# Patient Record
Sex: Male | Born: 1940 | Race: White | Hispanic: No | Marital: Married | Smoking: Former smoker
Health system: Southern US, Community
[De-identification: ages and names within clinical notes are randomized; demographics above are authoritative.]

## PROBLEM LIST (undated history)

## (undated) DIAGNOSIS — I440 Atrioventricular block, first degree: Secondary | ICD-10-CM

## (undated) DIAGNOSIS — I2589 Other forms of chronic ischemic heart disease: Secondary | ICD-10-CM

## (undated) DIAGNOSIS — I495 Sick sinus syndrome: Secondary | ICD-10-CM

## (undated) DIAGNOSIS — Z95810 Presence of automatic (implantable) cardiac defibrillator: Secondary | ICD-10-CM

## (undated) DIAGNOSIS — I498 Other specified cardiac arrhythmias: Secondary | ICD-10-CM

## (undated) DIAGNOSIS — E663 Overweight: Secondary | ICD-10-CM

## (undated) DIAGNOSIS — I4892 Unspecified atrial flutter: Secondary | ICD-10-CM

## (undated) DIAGNOSIS — I5022 Chronic systolic (congestive) heart failure: Secondary | ICD-10-CM

## (undated) HISTORY — DX: Chronic systolic (congestive) heart failure: I50.22

## (undated) HISTORY — DX: Atrioventricular block, first degree: I44.0

## (undated) HISTORY — DX: Other forms of chronic ischemic heart disease: I25.89

## (undated) HISTORY — DX: Sick sinus syndrome: I49.5

## (undated) HISTORY — DX: Overweight: E66.3

## (undated) HISTORY — DX: Other specified cardiac arrhythmias: I49.8

## (undated) HISTORY — DX: Presence of automatic (implantable) cardiac defibrillator: Z95.810

## (undated) HISTORY — DX: Unspecified atrial flutter: I48.92

---

## 2002-07-23 HISTORY — PX: CORONARY ARTERY BYPASS GRAFT: SHX141

## 2003-07-18 ENCOUNTER — Other Ambulatory Visit: Payer: Self-pay

## 2003-07-19 ENCOUNTER — Other Ambulatory Visit: Payer: Self-pay

## 2003-07-21 ENCOUNTER — Inpatient Hospital Stay (HOSPITAL_COMMUNITY)
Admission: AD | Admit: 2003-07-21 | Discharge: 2003-07-28 | Payer: Self-pay | Admitting: Thoracic Surgery (Cardiothoracic Vascular Surgery)

## 2003-07-24 HISTORY — PX: OTHER SURGICAL HISTORY: SHX169

## 2003-08-06 ENCOUNTER — Inpatient Hospital Stay (HOSPITAL_COMMUNITY): Admission: EM | Admit: 2003-08-06 | Discharge: 2003-09-06 | Payer: Self-pay | Admitting: Emergency Medicine

## 2003-09-09 ENCOUNTER — Inpatient Hospital Stay (HOSPITAL_COMMUNITY): Admission: EM | Admit: 2003-09-09 | Discharge: 2003-10-12 | Payer: Self-pay | Admitting: Emergency Medicine

## 2003-10-06 ENCOUNTER — Encounter: Payer: Self-pay | Admitting: Cardiology

## 2003-11-05 ENCOUNTER — Encounter
Admission: RE | Admit: 2003-11-05 | Discharge: 2003-11-05 | Payer: Self-pay | Admitting: Thoracic Surgery (Cardiothoracic Vascular Surgery)

## 2003-12-03 ENCOUNTER — Ambulatory Visit (HOSPITAL_COMMUNITY): Admission: RE | Admit: 2003-12-03 | Discharge: 2003-12-03 | Payer: Self-pay | Admitting: Plastic Surgery

## 2003-12-21 ENCOUNTER — Other Ambulatory Visit: Payer: Self-pay

## 2003-12-28 ENCOUNTER — Other Ambulatory Visit: Payer: Self-pay

## 2003-12-30 ENCOUNTER — Other Ambulatory Visit: Payer: Self-pay

## 2004-08-31 ENCOUNTER — Ambulatory Visit: Payer: Self-pay

## 2004-10-09 ENCOUNTER — Ambulatory Visit: Payer: Self-pay | Admitting: Internal Medicine

## 2004-10-24 ENCOUNTER — Emergency Department: Payer: Self-pay | Admitting: Emergency Medicine

## 2004-10-24 ENCOUNTER — Inpatient Hospital Stay: Payer: Self-pay | Admitting: Internal Medicine

## 2004-11-10 ENCOUNTER — Ambulatory Visit: Payer: Self-pay

## 2004-11-10 ENCOUNTER — Ambulatory Visit: Payer: Self-pay | Admitting: Cardiology

## 2004-11-15 ENCOUNTER — Ambulatory Visit: Payer: Self-pay | Admitting: Internal Medicine

## 2004-11-22 ENCOUNTER — Ambulatory Visit (HOSPITAL_COMMUNITY): Admission: RE | Admit: 2004-11-22 | Discharge: 2004-11-23 | Payer: Self-pay | Admitting: Internal Medicine

## 2004-11-22 ENCOUNTER — Ambulatory Visit: Payer: Self-pay | Admitting: Cardiology

## 2004-11-22 ENCOUNTER — Encounter: Payer: Self-pay | Admitting: Cardiology

## 2004-12-07 ENCOUNTER — Ambulatory Visit: Payer: Self-pay | Admitting: Internal Medicine

## 2004-12-12 ENCOUNTER — Ambulatory Visit: Payer: Self-pay | Admitting: Internal Medicine

## 2004-12-28 ENCOUNTER — Ambulatory Visit: Payer: Self-pay | Admitting: Internal Medicine

## 2006-10-07 ENCOUNTER — Other Ambulatory Visit: Payer: Self-pay

## 2006-10-08 ENCOUNTER — Inpatient Hospital Stay: Payer: Self-pay | Admitting: Internal Medicine

## 2007-02-02 ENCOUNTER — Inpatient Hospital Stay: Payer: Self-pay | Admitting: Internal Medicine

## 2007-02-02 ENCOUNTER — Other Ambulatory Visit: Payer: Self-pay

## 2007-02-26 ENCOUNTER — Ambulatory Visit: Payer: Self-pay | Admitting: Internal Medicine

## 2007-04-04 ENCOUNTER — Inpatient Hospital Stay: Payer: Self-pay | Admitting: Internal Medicine

## 2007-05-02 ENCOUNTER — Ambulatory Visit: Payer: Self-pay | Admitting: Internal Medicine

## 2007-05-23 ENCOUNTER — Ambulatory Visit: Payer: Self-pay | Admitting: Internal Medicine

## 2007-05-24 ENCOUNTER — Ambulatory Visit: Payer: Self-pay | Admitting: Internal Medicine

## 2007-05-24 ENCOUNTER — Inpatient Hospital Stay: Payer: Self-pay | Admitting: Internal Medicine

## 2007-05-24 ENCOUNTER — Other Ambulatory Visit: Payer: Self-pay

## 2007-05-30 ENCOUNTER — Other Ambulatory Visit: Payer: Self-pay

## 2007-06-05 ENCOUNTER — Other Ambulatory Visit: Payer: Self-pay

## 2007-06-24 ENCOUNTER — Ambulatory Visit: Payer: Self-pay | Admitting: Internal Medicine

## 2007-06-30 ENCOUNTER — Ambulatory Visit: Payer: Self-pay | Admitting: Cardiology

## 2007-09-03 ENCOUNTER — Ambulatory Visit: Payer: Self-pay | Admitting: Internal Medicine

## 2007-09-09 ENCOUNTER — Ambulatory Visit: Payer: Self-pay | Admitting: Internal Medicine

## 2007-10-30 ENCOUNTER — Ambulatory Visit: Payer: Self-pay | Admitting: Internal Medicine

## 2007-11-17 ENCOUNTER — Ambulatory Visit: Payer: Self-pay | Admitting: Internal Medicine

## 2008-02-02 ENCOUNTER — Ambulatory Visit: Payer: Self-pay | Admitting: Internal Medicine

## 2008-03-08 ENCOUNTER — Ambulatory Visit: Payer: Self-pay | Admitting: Internal Medicine

## 2008-05-03 ENCOUNTER — Ambulatory Visit: Payer: Self-pay | Admitting: Internal Medicine

## 2008-08-30 ENCOUNTER — Ambulatory Visit: Payer: Self-pay | Admitting: Internal Medicine

## 2008-12-10 DIAGNOSIS — E663 Overweight: Secondary | ICD-10-CM | POA: Insufficient documentation

## 2008-12-10 DIAGNOSIS — I440 Atrioventricular block, first degree: Secondary | ICD-10-CM | POA: Insufficient documentation

## 2008-12-10 DIAGNOSIS — I5022 Chronic systolic (congestive) heart failure: Secondary | ICD-10-CM

## 2008-12-10 DIAGNOSIS — I4891 Unspecified atrial fibrillation: Secondary | ICD-10-CM

## 2008-12-10 DIAGNOSIS — Z95810 Presence of automatic (implantable) cardiac defibrillator: Secondary | ICD-10-CM | POA: Insufficient documentation

## 2008-12-10 DIAGNOSIS — I498 Other specified cardiac arrhythmias: Secondary | ICD-10-CM

## 2008-12-10 DIAGNOSIS — I495 Sick sinus syndrome: Secondary | ICD-10-CM

## 2008-12-10 DIAGNOSIS — I2589 Other forms of chronic ischemic heart disease: Secondary | ICD-10-CM | POA: Insufficient documentation

## 2008-12-15 ENCOUNTER — Encounter: Payer: Self-pay | Admitting: Internal Medicine

## 2008-12-15 ENCOUNTER — Ambulatory Visit: Payer: Self-pay | Admitting: Internal Medicine

## 2009-02-28 ENCOUNTER — Ambulatory Visit: Payer: Self-pay | Admitting: Internal Medicine

## 2009-03-16 IMAGING — US US EXTREM LOW VENOUS*L*
1 series · 17 of 24 positions shown · non-contrast
Comparison: none

REASON FOR EXAM: eval DVT  CALL report  [id number]  ext [id number]
COMMENTS:

PROCEDURE:     US  - US DOPPLER LOW EXTR LEFT  - May 02, 2007  [DATE]
RESULT:     There is observed loss of compressibility of the popliteal vein.
Doppler examination shows occlusion to venous flow in the popliteal area.
The common femoral and superficial femoral veins are patent.

[Series 1: us extrem low venous*left* · 17 of 25 slices shown]
[im 1/25]
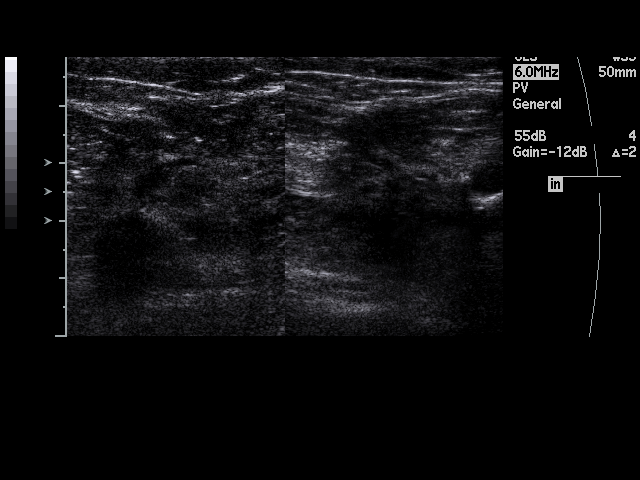
[im 3/25]
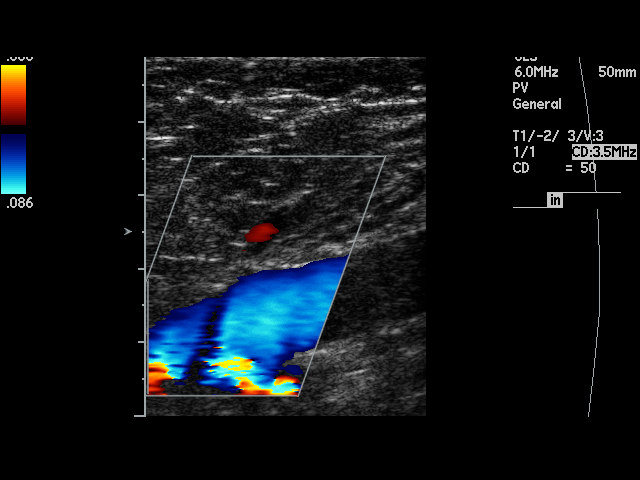
[im 4/25]
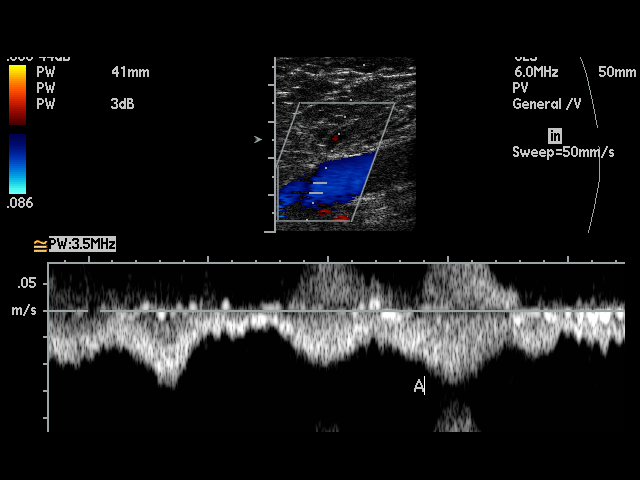
[im 5/25]
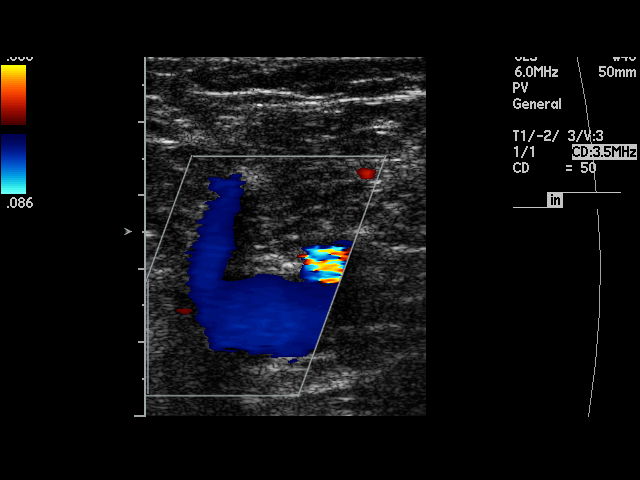
[im 7/25]
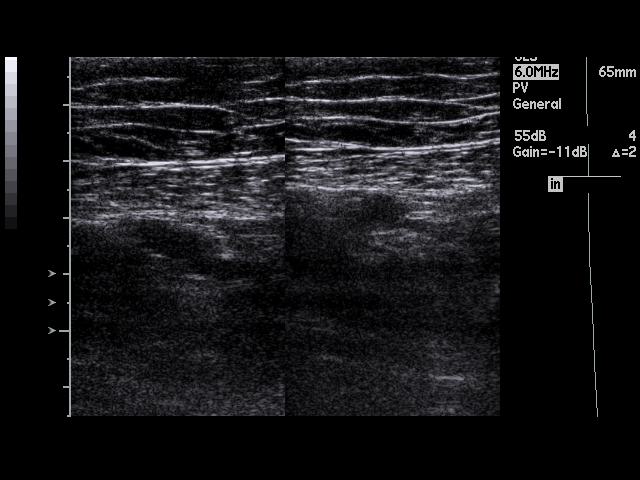
[im 8/25]
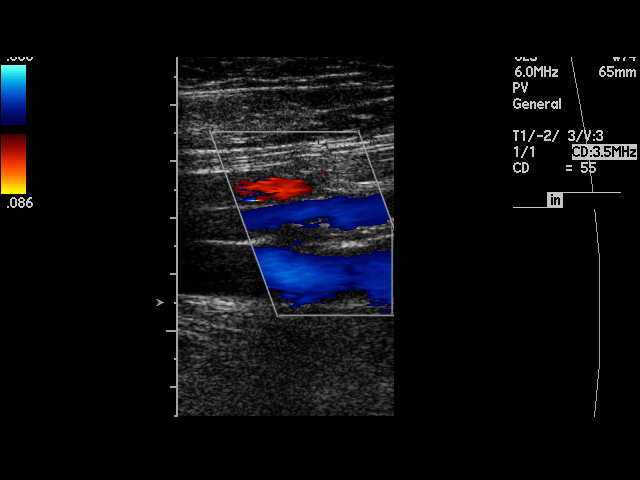
[im 10/25]
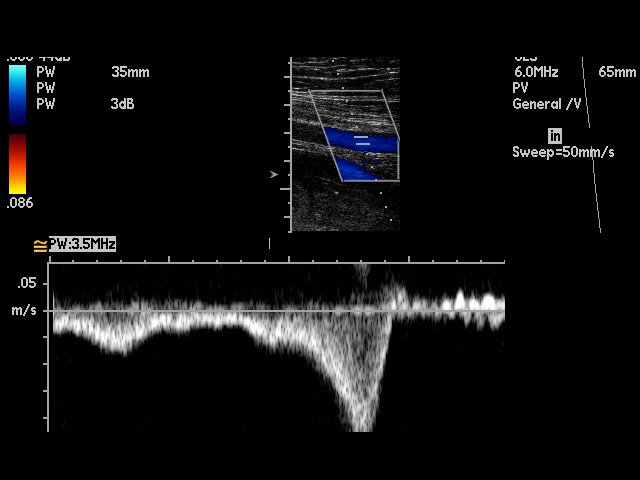
[im 11/25]
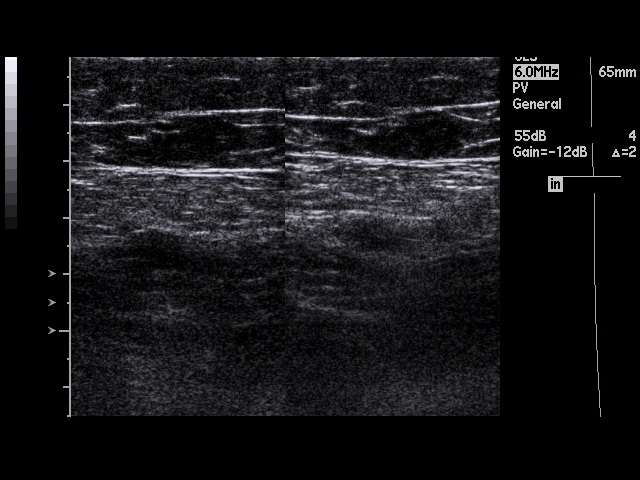
[im 13/25]
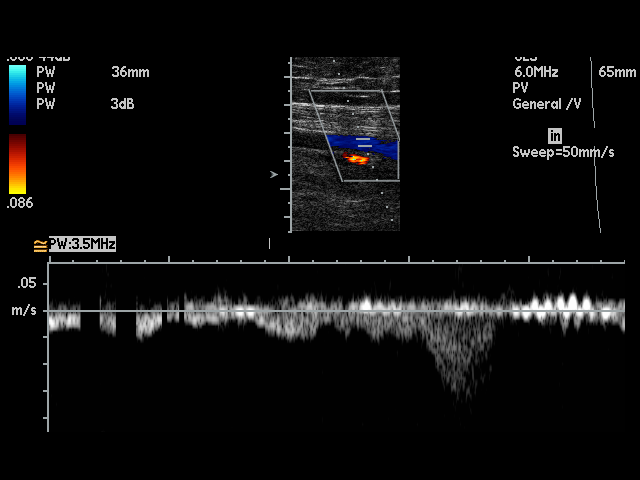
[im 14/25]
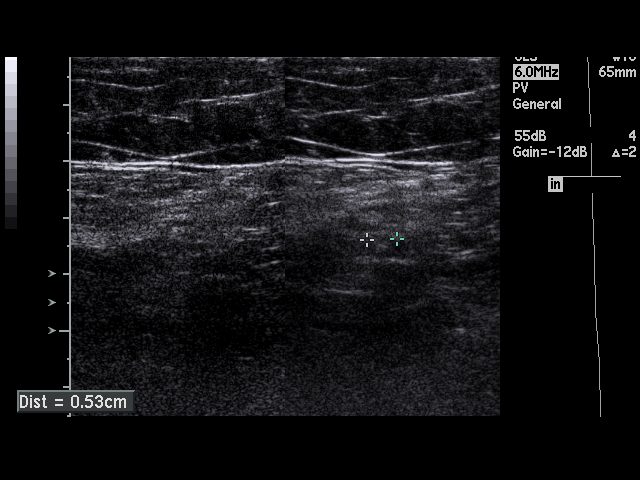
[im 15/25]
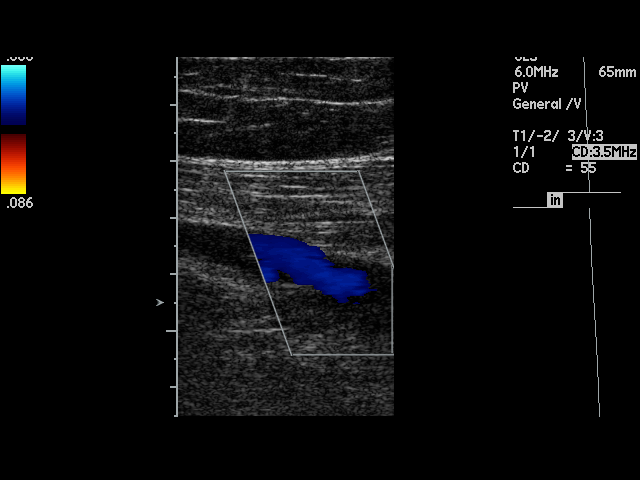
[im 17/25]
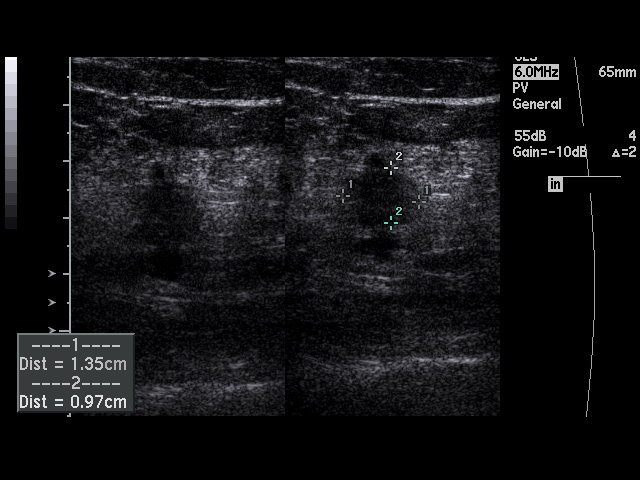
[im 18/25]
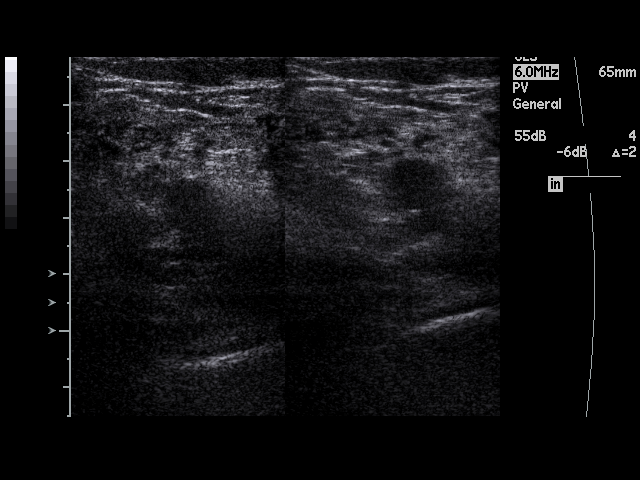
[im 20/25]
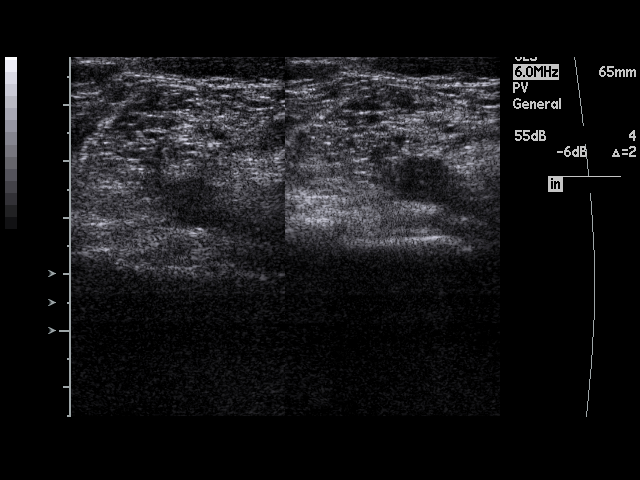
[im 21/25]
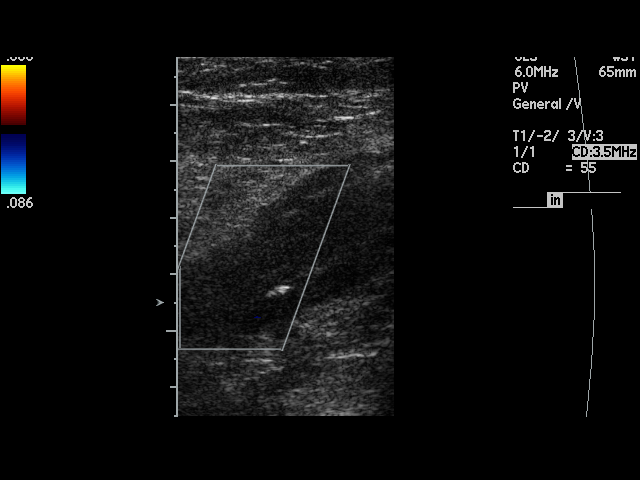
[im 22/25]
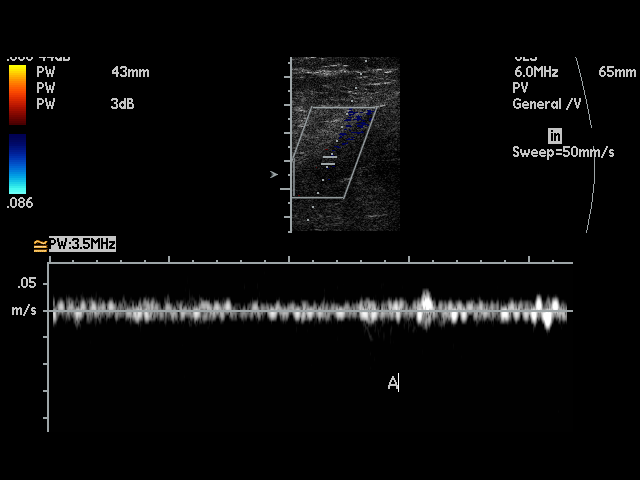
[im 25/25]
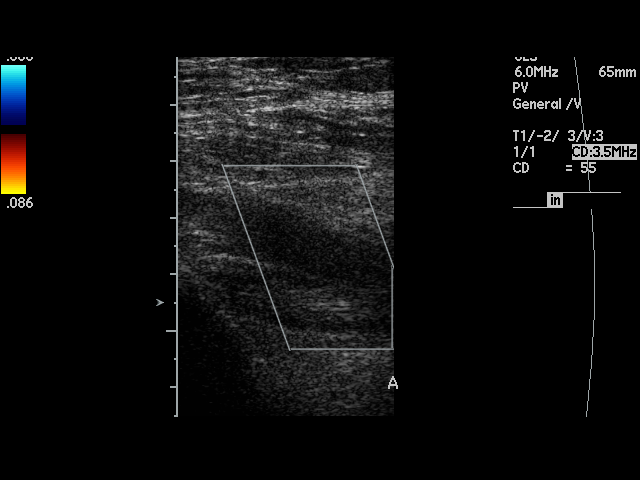

[17 of 24 positions shown; findings below may reference images not displayed]

IMPRESSION: There is occlusive deep venous thrombosis in the LEFT popliteal vein.

## 2009-04-24 ENCOUNTER — Emergency Department: Payer: Medicare Other | Admitting: Emergency Medicine

## 2009-04-29 ENCOUNTER — Inpatient Hospital Stay: Payer: Medicare Other | Admitting: Internal Medicine

## 2009-05-16 ENCOUNTER — Ambulatory Visit: Payer: Self-pay | Admitting: Internal Medicine

## 2009-08-22 ENCOUNTER — Ambulatory Visit: Payer: Self-pay | Admitting: Internal Medicine

## 2009-12-08 ENCOUNTER — Ambulatory Visit: Payer: Self-pay | Admitting: Internal Medicine

## 2010-03-06 ENCOUNTER — Ambulatory Visit: Payer: Self-pay | Admitting: Internal Medicine

## 2010-05-26 ENCOUNTER — Ambulatory Visit: Payer: Self-pay | Admitting: Internal Medicine

## 2010-05-26 ENCOUNTER — Encounter: Payer: Self-pay | Admitting: Internal Medicine

## 2010-07-18 ENCOUNTER — Inpatient Hospital Stay: Payer: Medicare Other | Admitting: Internal Medicine

## 2010-07-23 ENCOUNTER — Emergency Department: Payer: Medicare Other | Admitting: Emergency Medicine

## 2010-07-27 ENCOUNTER — Inpatient Hospital Stay: Payer: Medicare Other | Admitting: Internal Medicine

## 2010-08-06 ENCOUNTER — Inpatient Hospital Stay: Payer: Medicare Other | Admitting: Internal Medicine

## 2010-08-07 ENCOUNTER — Telehealth (INDEPENDENT_AMBULATORY_CARE_PROVIDER_SITE_OTHER): Payer: Self-pay | Admitting: *Deleted

## 2010-08-13 ENCOUNTER — Encounter: Payer: Self-pay | Admitting: Thoracic Surgery (Cardiothoracic Vascular Surgery)

## 2010-08-14 ENCOUNTER — Inpatient Hospital Stay: Payer: Medicare Other | Admitting: Internal Medicine

## 2010-08-22 NOTE — Procedures (Signed)
Summary: PC2/AMD    Current Medications (verified): 1)  Daily Combo Multi Vitamins  Tabs (Multiple Vitamins-Minerals) .... One By Mouth Daily 2)  Klor-Con 10 10 Meq Cr-Tabs (Potassium Chloride) .... One By Mouth Daily 3)  Furosemide 20 Mg Tabs (Furosemide) .... One By Mouth Two Times A Day 4)  Warfarin Sodium 1 Mg Tabs (Warfarin Sodium) .... One By Mouth As Directed 5)  Captopril 25 Mg Tabs (Captopril) .... One By Mouth Two Times A Day 6)  Pantoprazole Sodium 40 Mg Tbec (Pantoprazole Sodium) .... One By Mouth Daily 7)  Simvastatin 40 Mg Tabs (Simvastatin) .... One By Mouth Daily 8)  Carvedilol 25 Mg Tabs (Carvedilol) .Marland Kitchen.. 1 1/2 Tablets By Mouth Two Times A Day 9)  Advair Diskus 250-50 Mcg/dose Aepb (Fluticasone-Salmeterol) .... As Needed 10)  Spiriva Handihaler 18 Mcg Caps (Tiotropium Bromide Monohydrate) .... Daily 11)  Albuterol Sulfate (5 Mg/ml) 0.5% Nebu (Albuterol Sulfate) .... As Needed  Allergies: 1)  ! * Lipitor 2)  * Aldactone    ICD Specifications Following MD:  Sherryl Manges, MD     ICD Vendor:  [employer]     ICD Model Number:  D284DRG     ICD Serial Number:  [id number] ICD DOI:  06/04/2007     ICD Implanting MD:  Sherryl Manges, MD  Lead 1:    Location: RA     DOI: 06/04/2007     Model #: [id number]     Serial #: [id number]     Status: active Lead 2:    Location: RV     DOI: 06/04/2007     Model #: [id number]     Serial #: [id number]     Status: active  Indications::  ICM   ICD Follow Up Remote Check?  No Battery Voltage:  3.08 V     Charge Time:  9.5 seconds     Underlying rhythm:  SR ICD Dependent:  No       ICD Device Measurements Atrium:  Amplitude: 1.3 mV, Impedance: 589 ohms, Threshold: 0.5 V at 0.4 msec Right Ventricle:  Amplitude: 10.6 mV, Impedance: 532 ohms, Threshold: 0.5 V at 0.4 msec Shock Impedance: 51/62 ohms   Episodes MS Episodes:  0     Percent Mode Switch:  0     Coumadin:  Yes Shock:  0     ATP:  0     Nonsustained:  0     Atrial Pacing:  17.1%      Ventricular Pacing:  0.2%  Brady Parameters Mode DDD+     Lower Rate Limit:  60     Upper Rate Limit 130 PAV 180     Sensed AV Delay:  150  Tachy Zones VF:  231     VT:  200     VT1:  182     Next Cardiology Appt Due:  02/20/2010 Tech Comments:  No parameter changes.  Device function normal.  ROV 3 months in the [place] [facility]. Altha Harm, LPN  Dec 08, 2009 8:25 AM

## 2010-08-22 NOTE — Procedures (Signed)
Summary: icd check/medtronic      Allergies Added:   Current Medications (verified): 1)  Daily Combo Multi Vitamins  Tabs (Multiple Vitamins-Minerals) .... One By Mouth Daily 2)  Klor-Con 10 10 Meq Cr-Tabs (Potassium Chloride) .... One By Mouth Daily 3)  Furosemide 20 Mg Tabs (Furosemide) .... One By Mouth Two Times A Day 4)  Warfarin Sodium 1 Mg Tabs (Warfarin Sodium) .... One By Mouth As Directed 5)  Captopril 25 Mg Tabs (Captopril) .... One By Mouth Two Times A Day 6)  Pantoprazole Sodium 40 Mg Tbec (Pantoprazole Sodium) .... One By Mouth Daily 7)  Simvastatin 40 Mg Tabs (Simvastatin) .... One By Mouth Daily 8)  Carvedilol 25 Mg Tabs (Carvedilol) .Marland Kitchen.. 1 1/2 Tablets By Mouth Two Times A Day 9)  Advair Diskus 250-50 Mcg/dose Aepb (Fluticasone-Salmeterol) .... As Needed 10)  Spiriva Handihaler 18 Mcg Caps (Tiotropium Bromide Monohydrate) .... Daily 11)  Albuterol Sulfate (5 Mg/ml) 0.5% Nebu (Albuterol Sulfate) .... As Needed  Allergies (verified): 1)  ! * Lipitor 2)  * Aldactone   ICD Specifications Following MD:  Sherryl Manges, MD     ICD Vendor:  [employer]     ICD Model Number:  D284DRG     ICD Serial Number:  [id number] ICD DOI:  06/04/2007     ICD Implanting MD:  Sherryl Manges, MD  Lead 1:    Location: RA     DOI: 06/04/2007     Model #: [id number]     Serial #: [id number]     Status: active Lead 2:    Location: RV     DOI: 06/04/2007     Model #: [id number]     Serial #: [id number]     Status: active  Indications::  ICM   ICD Follow Up Remote Check?  No Battery Voltage:  3.11 V     Charge Time:  9.5 seconds     Underlying rhythm:  SR ICD Dependent:  No       ICD Device Measurements Atrium:  Amplitude: 1.4 mV, Impedance: 589 ohms, Threshold: 0.75 V at 0.4 msec Right Ventricle:  Amplitude: 10.3 mV, Impedance: 551 ohms, Threshold: 0.5 V at 0.4 msec Shock Impedance: 55/69 ohms   Episodes MS Episodes:  0     Percent Mode Switch:  0     Coumadin:  Yes Shock:  0     ATP:  0      Nonsustained:  3     Atrial Pacing:  14.5%     Ventricular Pacing:  0.2%  Brady Parameters Mode DDD+     Lower Rate Limit:  60     Upper Rate Limit 130 PAV 180     Sensed AV Delay:  150  Tachy Zones VF:  231     VT:  200     VT1:  182     Next Cardiology Appt Due:  05/23/2010 Tech Comments:  No parameter changes.  3 NSVT episodes the longest 3 seconds 1:1 conduction.  Device function normal.  ROV 3 months [place] [facility]. Altha Harm, LPN  March 06, 2010 10:23 AM

## 2010-08-22 NOTE — Cardiovascular Report (Signed)
Summary: Office Visit   Office Visit   Imported By: Roderic Ovens 03/15/2010 15:21:57  _____________________________________________________________________  External Attachment:    Type:   Image     Comment:   External Document

## 2010-08-22 NOTE — Miscellaneous (Signed)
Summary: Device preload  Clinical Lists Changes  Observations: Added new observation of ICD INDICATN: ICM (08/30/2008 11:54) Added new observation of ICDLEADSTAT2: active (08/30/2008 11:54) Added new observation of ICDLEADSER2: [id number] (08/30/2008 11:54) Added new observation of ICDLEADMOD2: 4098  (08/30/2008 11:54) Added new observation of ICDLEADDOI2: 06/04/2007  (08/30/2008 11:54) Added new observation of ICDLEADLOC2: RV  (08/30/2008 11:54) Added new observation of ICDLEADSTAT1: active  (08/30/2008 11:54) Added new observation of ICDLEADSER1: [id number]  (08/30/2008 11:54) Added new observation of ICDLEADMOD1: 5076  (08/30/2008 11:54) Added new observation of ICDLEADDOI1: 06/04/2007  (08/30/2008 11:54) Added new observation of ICDLEADLOC1: RA  (08/30/2008 11:54) Added new observation of ICD IMP MD: Sherryl Manges, MD  (08/30/2008 11:54) Added new observation of ICD IMPL DTE: 06/04/2007  (08/30/2008 11:54) Added new observation of ICD SERL#: [id number] H  (08/30/2008 11:54) Added new observation of ICD MODL#: D284DRG  (08/30/2008 11:54) Added new observation of ICDMANUFACTR: [employer]  (08/30/2008 11:54) Added new observation of CARDIO MD: Sherryl Manges, MD  (08/30/2008 11:54)      PPM Specifications Following MD:  Sherryl Manges, MD       ICD Specifications Following MD: Sherryl Manges, MD      ICD Vendor: [employer]     ICD Model Number: D284DRG     ICD Serial Number: [id number]  ICD DOI: 06/04/2007     ICD Implanting MD: Sherryl Manges, MD  Lead 1:    Location: RA     DOI: 06/04/2007     Model #: 8413     Serial #: [id number]     Status: active Lead 2:    Location: RV     DOI: 06/04/2007     Model #: 3664     Serial #: [id number]     Status: active  Indications::  ICM

## 2010-08-22 NOTE — Cardiovascular Report (Signed)
Summary: Office Visit   Office Visit   Imported By: Roderic Ovens 06/01/2010 14:28:59  _____________________________________________________________________  External Attachment:    Type:   Image     Comment:   External Document

## 2010-08-22 NOTE — Procedures (Signed)
Summary: PACER/AMD      Allergies Added:   Current Medications (verified): 1)  Daily Combo Multi Vitamins  Tabs (Multiple Vitamins-Minerals) .... One By Mouth Daily 2)  Klor-Con 10 10 Meq Cr-Tabs (Potassium Chloride) .... One By Mouth Daily 3)  Furosemide 20 Mg Tabs (Furosemide) .... One By Mouth Two Times A Day 4)  Warfarin Sodium 1 Mg Tabs (Warfarin Sodium) .... One By Mouth As Directed 5)  Captopril 25 Mg Tabs (Captopril) .... One By Mouth Two Times A Day 6)  Pantoprazole Sodium 40 Mg Tbec (Pantoprazole Sodium) .... One By Mouth Daily 7)  Simvastatin 40 Mg Tabs (Simvastatin) .... One By Mouth Daily 8)  Carvedilol 25 Mg Tabs (Carvedilol) .Marland Kitchen.. 1 1/2 Tablets By Mouth Two Times A Day 9)  Advair Diskus 250-50 Mcg/dose Aepb (Fluticasone-Salmeterol) .... As Needed 10)  Spiriva Handihaler 18 Mcg Caps (Tiotropium Bromide Monohydrate) .... Daily 11)  Albuterol Sulfate (5 Mg/ml) 0.5% Nebu (Albuterol Sulfate) .... As Needed  Allergies (verified): 1)  ! * Lipitor 2)  * Aldactone   ICD Specifications Following MD:  Sherryl Manges, MD     ICD Vendor:  [employer]     ICD Model Number:  D284DRG     ICD Serial Number:  [id number] ICD DOI:  06/04/2007     ICD Implanting MD:  Sherryl Manges, MD  Lead 1:    Location: RA     DOI: 06/04/2007     Model #: 8295     Serial #: [id number]     Status: active Lead 2:    Location: RV     DOI: 06/04/2007     Model #: 4696     Serial #: [id number]     Status: active  Indications::  ICM   ICD Follow Up Remote Check?  No Battery Voltage:  3.10 V     Charge Time:  9.5 seconds     Underlying rhythm:  SR ICD Dependent:  No       ICD Device Measurements Atrium:  Amplitude: 1.3 mV, Impedance: 551 ohms, Threshold: 0.75 V at 0.4 msec Right Ventricle:  Amplitude: 4.1 mV, Impedance: 532 ohms, Threshold: 1.0 V at 0.4 msec Shock Impedance: 48/64 ohms   Episodes MS Episodes:  0     Percent Mode Switch:  0     Coumadin:  Yes Shock:  0     ATP:  0     Nonsustained:  3      Atrial Pacing:  8.5%     Ventricular Pacing:  0.2%  Brady Parameters Mode DDD+     Lower Rate Limit:  60     Upper Rate Limit 130 PAV 180     Sensed AV Delay:  150  Tachy Zones VF:  231     VT:  200     VT1:  182     Next Cardiology Appt Due:  08/23/2010 Tech Comments:  No parameter changes.  Device function normal.  ROV 3 months with Dr. Graciela Husbands in [place]. Altha Harm, LPN  May 26, 2010 3:43 PM

## 2010-08-22 NOTE — Cardiovascular Report (Signed)
Summary: Office Visit   Office Visit   Imported By: Roderic Ovens 12/23/2009 10:24:29  _____________________________________________________________________  External Attachment:    Type:   Image     Comment:   External Document

## 2010-08-22 NOTE — Assessment & Plan Note (Signed)
Summary: F3M/AMD  Medications Added CAPTOPRIL 25 MG TABS (CAPTOPRIL) one by mouth two times a day CARVEDILOL 25 MG TABS (CARVEDILOL) 1 1/2 tablets by mouth two times a day ADVAIR DISKUS 250-50 MCG/DOSE AEPB (FLUTICASONE-SALMETEROL) as needed ALBUTEROL SULFATE (5 MG/ML) 0.5% NEBU (ALBUTEROL SULFATE) as needed      Allergies Added:   Visit Type:  Follow-up  CC:  no complaints.  History of Present Illness:   Gary [name] is seen in followup for an ICD implanted for primary prevention and assay of ischemic heart disease and depressed left ventricular function. He has history of atrial flutter and is status post ablation he's had recurrent atrial fibrillation associated with inappropriate ICD discharges. He has chronic congestive heart failure in the setting of a normal QRS  The patient denies chest pain, edema or palpitations;  His shortness of breath is chronic and unchanged. He remains limited walking more than about 100 250 feet .    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 One By Mouth Daily 2)  Klor-Con 10 10 Meq Cr-Tabs (Potassium Chloride) .... One By Mouth Daily 3)  Furosemide 20 Mg Tabs (Furosemide) .... One By Mouth Two Times A Day 4)  Warfarin Sodium 1 Mg Tabs (Warfarin Sodium) .... One By Mouth As Directed 5)  Captopril 25 Mg Tabs (Captopril) .... One By Mouth Two Times A Day 6)  Pantoprazole Sodium 40 Mg Tbec (Pantoprazole Sodium) .... One By Mouth Daily 7)  Simvastatin 40 Mg Tabs (Simvastatin) .... One By Mouth  Daily 8)  Carvedilol 25 Mg Tabs (Carvedilol) .Marland Kitchen.. 1 1/2 Tablets By Mouth Two Times A Day 9)  Advair Diskus 250-50 Mcg/dose Aepb (Fluticasone-Salmeterol) .... As Needed 10)  Spiriva Handihaler 18 Mcg Caps (Tiotropium Bromide Monohydrate) .... Daily 11)  Albuterol Sulfate (5 Mg/ml) 0.5% Nebu (Albuterol Sulfate) .... As Needed  Allergies (verified): 1)  ! * Lipitor  Past History:  Past Medical History: Last updated: 12/10/2008 ICD - IN SITU (ICD-V45.02) SICK SINUS/ TACHY-BRADY SYNDROME (ICD-427.81) AV BLOCK, 1ST DEGREE (ICD-426.11) BRADYCARDIA (ICD-427.89) CARDIOMYOPATHY, ISCHEMIC (ICD-414.8) SYSTOLIC HEART FAILURE, CHRONIC (ICD-428.22) ATRIAL FLUTTER (ICD-427.32) OVERWEIGHT/OBESITY (ICD-278.02)    Past Surgical History: Last updated: 12/10/2008 CABG - '04 muscle flap surgery - '05  Family History: Last updated: 12/10/2008 Family History of Coronary Artery Disease:   Social History: Last updated: 12/10/2008 Disabled  Married  Tobacco Use - Former.  - '04 Alcohol Use - no Regular Exercise - no Drug Use - no  Risk Factors: Exercise: no (12/10/2008)  Risk Factors: Smoking Status: quit (12/10/2008)  Vital Signs:  Patient profile:   70 year old male Height:      67 inches Weight:      257 pounds BMI:     40.40 Pulse rate:   70 / minute Pulse rhythm:   regular BP sitting:   102 / 70  (left arm) Cuff size:   large  Vitals Entered By: Mercer Pod (August 22, 2009 10:42 AM)  Physical Exam  General:  Well developed, well nourished, in no acute distress. Head:  HEENT normal  except edentulous Chest Wall:  no deformities or breast masses noted Lungs:  Clear bilaterally to auscultation and percussion. Heart:  regular rate and rhythm. JVP appeared to be elevated about 8-9 cm Abdomen:  soft protuberant obese bowel sounds positive Extremities:  trace peripheral edema Neurologic:  Alert and oriented x 3.grossly normal    ICD Specifications Following MD:   Sherryl Manges, MD     ICD Vendor:  [employer]     ICD Model Number:  D284DRG     ICD Serial Number:  [id number] ICD DOI:  06/04/2007     ICD Implanting MD:  Sherryl Manges, MD  Lead 1:    Location: RA     DOI: 06/04/2007     Model #: 4010     Serial #: [id number]     Status: active Lead 2:    Location: RV     DOI: 06/04/2007     Model #: 3474     Serial #: [id number]     Status: active  Indications::  ICM   ICD Follow Up Remote Check?  No Battery Voltage:  3.13 V     Charge Time:  9.3 seconds     Underlying rhythm:  SR ICD Dependent:  No       ICD Device Measurements Atrium:  Amplitude: 1.4 mV, Impedance: 589 ohms, Threshold: 0.75 V at 0.4 msec Right Ventricle:  Amplitude: 10.1 mV, Impedance: 532 ohms, Threshold: 0.75 V at 0.4 msec Shock Impedance: 51/61 ohms   Episodes MS Episodes:  0     Percent Mode Switch:  0     Coumadin:  Yes Shock:  0     ATP:  0     Nonsustained:  0     Atrial Pacing:  18%     Ventricular Pacing:  0.2%  Brady Parameters Mode DDD+     Lower Rate Limit:  60     Upper Rate Limit 130 PAV 180     Sensed AV Delay:  150  Tachy Zones VF:  231     VT:  200     VT1:  182     Next Cardiology Appt Due:  10/21/2009 Tech Comments:  No parameter changes.  Device function normal.  ROV 3 months [place] [facility]. Altha Harm, LPN  August 22, 2009 10:53 AM   Impression & Recommendations:  Problem # 1:  SYSTOLIC HEART FAILURE, CHRONIC (ICD-428.22) we'll continue him on his current medications. I have suggested that he talk to his primary cardiologist about the introduction of Aldactone antagonist. He will be seeing him next week. His updated medication list for this problem includes:    Furosemide 20 Mg Tabs (Furosemide) ..... One by mouth two times a day    Warfarin Sodium 1 Mg Tabs (Warfarin sodium) ..... One by mouth as directed    Captopril 25 Mg Tabs (Captopril) ..... One by mouth two times a day    Carvedilol 25 Mg Tabs (Carvedilol) .Marland Kitchen... 1 1/2 tablets by mouth two  times a day  Problem # 2:  ICD - IN SITU (ICD-V45.02) Device parameters and data were reviewed and no changes were made  no intercurrent therapy  Problem # 3:  CARDIOMYOPATHY, ISCHEMIC (ICD-414.8) the patient denies problems chest pain continue his current medications except as noted above His updated medication list for this problem includes:    Furosemide 20 Mg Tabs (Furosemide) ..... One by mouth two times a day    Warfarin Sodium 1 Mg Tabs (Warfarin sodium) .Marland KitchenMarland KitchenMarland KitchenMarland Kitchen  One by mouth as directed    Captopril 25 Mg Tabs (Captopril) ..... One by mouth two times a day    Carvedilol 25 Mg Tabs (Carvedilol) .Marland Kitchen... 1 1/2 tablets by mouth two times a day

## 2010-08-24 NOTE — Progress Notes (Signed)
   Phone Note Call from Patient Call back at [phone]   Caller: Patient Call For: Klein/nurse Summary of Call: Pt was in hospital 2 weeks ago and was wondering if he still needs to come in to see Dr. Graciela Husbands this month (per reminder) . pt had pacemaker checked in hospital at that time. Please advise Initial call taken by: Lysbeth Galas CMA,  August 07, 2010 1:15 PM  Follow-up for Phone Call         Per patient's wife, he is still in the  hospital, being moved to CCU.  We will wait to see the outcome of his hospital stay for further follow up. Follow-up by: Altha Harm, LPN,  August 08, 2010 9:06 AM

## 2010-10-09 ENCOUNTER — Encounter: Payer: Self-pay | Admitting: Internal Medicine

## 2010-10-09 ENCOUNTER — Encounter (INDEPENDENT_AMBULATORY_CARE_PROVIDER_SITE_OTHER): Payer: No Typology Code available for payment source | Admitting: Internal Medicine

## 2010-10-09 DIAGNOSIS — I2589 Other forms of chronic ischemic heart disease: Secondary | ICD-10-CM

## 2010-10-09 DIAGNOSIS — I5022 Chronic systolic (congestive) heart failure: Secondary | ICD-10-CM

## 2010-10-09 DIAGNOSIS — I495 Sick sinus syndrome: Secondary | ICD-10-CM

## 2010-10-19 NOTE — Assessment & Plan Note (Signed)
Summary: DEFIB/AMD  Medications Added FUROSEMIDE 40 MG TABS (FUROSEMIDE) Take one tablet by mouth in morning and 1/2 tablet in the evening PANTOPRAZOLE SODIUM 40 MG TBEC (PANTOPRAZOLE SODIUM) one tablet two times a day CARVEDILOL 12.5 MG TABS (CARVEDILOL) Take 1 1/2  tablets by mouth twice a day PREDNISONE 5 MG TABS (PREDNISONE) 1 tablet once daily DILTIAZEM HCL ER BEADS 120 MG XR24H-CAP (DILTIAZEM HCL ER BEADS) Take one capsule by mouth daily SERTRALINE HCL 50 MG TABS (SERTRALINE HCL) 1 tablet once daily      Allergies Added:   Visit Type:  Initial Consult Primary Provider:  Dr. Marcello Fennel  CC:  c/o SOB all the time and CHF. denies chest pain.Marland Kitchen  History of Present Illness:   Gary [name] is seen in followup for an ICD implanted for primary prevention ain the setting of  ischemic heart disease and depressed left ventricular function. He has history of atrial flutter and is status post ablation.  he's had recurrent atrial fibrillation associated with inappropriate ICD discharges. He has chronic congestive heart failure in the setting of a normal QRS  The patient denies chest pain, edema or palpitations;  His shortness of breath is chronic and unchanged. He remains limited walking more than about 100 250 feet .  He was hospitalized from January for congestive heart failure. His ICD runoff at the end of December. This seems unrelated to atrial fibrillation which happened a few weeks later.    he continues to struggle with his weight going up   Current Medications (verified): 1)  Daily Combo Multi Vitamins  Tabs (Multiple Vitamins-Minerals) .... One By Mouth Daily 2)  Klor-Con 10 10 Meq Cr-Tabs (Potassium Chloride) .... One By Mouth Daily 3)  Furosemide 40 Mg Tabs (Furosemide) .... Take One Tablet By Mouth in Morning and 1/2 Tablet in The Evening 4)  Warfarin Sodium 1 Mg Tabs (Warfarin Sodium) .... One By Mouth As Directed 5)  Captopril 25 Mg Tabs (Captopril) .... One By Mouth Two Times A  Day 6)  Pantoprazole Sodium 40 Mg Tbec (Pantoprazole Sodium) .... One Tablet Two Times A Day 7)  Simvastatin 40 Mg Tabs (Simvastatin) .... One By Mouth Daily 8)  Carvedilol 12.5 Mg Tabs (Carvedilol) .... Take 1 1/2  Tablets By Mouth Twice A Day 9)  Advair Diskus 250-50 Mcg/dose Aepb (Fluticasone-Salmeterol) .... As Needed 10)  Spiriva Handihaler 18 Mcg Caps (Tiotropium Bromide Monohydrate) .... Daily 11)  Albuterol Sulfate (5 Mg/ml) 0.5% Nebu (Albuterol Sulfate) .... As Needed 12)  Prednisone 5 Mg Tabs (Prednisone) .Marland Kitchen.. 1 Tablet Once Daily 13)  Diltiazem Hcl Er Beads 120 Mg Xr24h-Cap (Diltiazem Hcl Er Beads) .... Take One Capsule By Mouth Daily 14)  Sertraline Hcl 50 Mg Tabs (Sertraline Hcl) .Marland Kitchen.. 1 Tablet Once Daily  Allergies (verified): 1)  ! 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  - '04 Alcohol Use - no Regular Exercise - no Drug Use - no  Risk Factors: Exercise: no (12/10/2008)  Risk Factors: Smoking Status: quit (12/10/2008)  Vital Signs:  Patient profile:   70 year old male Height:      67 inches Weight:      244.50 pounds BMI:     38.43 Pulse rate:   64 / minute BP sitting:   118 / 60  (left arm) Cuff size:   large  Vitals Entered By: Lysbeth Galas CMA (October 09, 2010 10:12 AM)  Physical Exam  General:  The patient was alert and oriented in no acute distress. HEENT Normal.   Neck veins were  8-9; carotids were brisk.  Lungs were clear.  Heart sounds were regular without murmurs or gallops.  Abdomen was soft with active bowel sounds. There is no clubbing cyanosis  2+ edema  Skin Warm and dry     ICD Specifications Following MD:  Sherryl Manges, MD     ICD Vendor:  Medtronic     ICD Model Number:  [id number]     ICD Serial Number:  [id number] ICD DOI:  06/04/2007     ICD Implanting MD:  Sherryl Manges, MD  Lead 1:    Location: [facility]     DOI: 06/04/2007     Model #: 1308     Serial #: [id number]     Status: active Lead 2:    Location: RV     DOI: 06/04/2007     Model #: 5284     Serial #: [id number]     Status: active  Indications::  ICM   ICD Follow Up ICD Dependent:  No      Episodes Coumadin:  Yes  Brady Parameters Mode DDD+     Lower Rate Limit:  60     Upper Rate Limit 130 PAV 180     Sensed AV Delay:  150  Tachy Zones VF:  231     VT:  200     VT1:  182     Impression & Recommendations:  Problem # 1:  ICD - IN SITU (ICD-V45.02) Device parameters and data were reviewed and no changes were made  Problem # 2:  SICK SINUS/ 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 One by mouth as directed    Captopril 25 Mg Tabs (Captopril) ..... One by mouth two times a day    Carvedilol 12.5 Mg Tabs (Carvedilol) .Marland Kitchen... Take 1 1/2  tablets by mouth twice a day    Diltiazem Hcl Er Beads 120 Mg Xr24h-cap (Diltiazem hcl er beads) .Marland Kitchen... Take one capsule by mouth daily  Problem # 3:  CARDIOMYOPATHY, ISCHEMIC (ICD-414.8) stable  His updated medication list for this problem includes:    Furosemide 40 Mg Tabs (Furosemide) .Marland Kitchen... Take one tablet by mouth in morning and 1/2 tablet in the evening    Warfarin Sodium 1 Mg Tabs (Warfarin sodium) ..... One by mouth as directed    Captopril 25 Mg Tabs (Captopril) ..... One by mouth two times a day    Carvedilol 12.5 Mg  Tabs (Carvedilol) .Marland Kitchen... Take 1 1/2  tablets by mouth twice a day    Diltiazem Hcl Er Beads 120 Mg Xr24h-cap (Diltiazem hcl er beads) .Marland Kitchen... Take one capsule by mouth daily  Problem # 4:  SYSTOLIC HEART FAILURE, CHRONIC (ICD-428.22) stab;e; unfortunately his QRS is narrow, and so he is limited to medical therapy His updated medication list for this problem includes:    Furosemide 40 Mg Tabs (Furosemide) .Marland Kitchen... Take one tablet  by mouth in morning and 1/2 tablet in the evening    Warfarin Sodium 1 Mg Tabs (Warfarin sodium) ..... One by mouth as directed    Captopril 25 Mg Tabs (Captopril) ..... One by mouth two times a day    Carvedilol 12.5 Mg Tabs (Carvedilol) .Marland Kitchen... Take 1 1/2  tablets by mouth twice a day    Diltiazem Hcl Er Beads 120 Mg Xr24h-cap (Diltiazem hcl er beads) .Marland Kitchen... Take one capsule by mouth daily

## 2010-11-08 ENCOUNTER — Inpatient Hospital Stay: Payer: Medicare Other | Admitting: Internal Medicine

## 2010-11-13 ENCOUNTER — Inpatient Hospital Stay: Payer: Medicare Other | Admitting: Internal Medicine

## 2010-11-21 ENCOUNTER — Inpatient Hospital Stay: Payer: Medicare Other | Admitting: Internal Medicine

## 2010-12-05 NOTE — Letter (Signed)
May 03, 2008    Dr. Corliss Parish  [facility]  [address]  [place], [place]  [address]   RE:  Gary [name], Gary [name]  MRN:  [id number]  /  DOB:  01/04/1941   Dear Gary [name]:   Gary [name] comes in today in followup for his ischemic heart disease,  previously implanted defibrillator and atrial flutter ablation.  He has  shortness of breath as it is anticipated.  Recently apparently had  bronchitis, for which he was treated with antibiotics (see below).  He  also some peripheral edema.   MEDICATIONS:  Notable for,  1. Furosemide 20 b.i.d.  2. Captopril 25 b.i.d.  3. Carvedilol 25 b.i.d.  4. Simvastatin.  5. Spiriva.  6. Advair.   PHYSICAL EXAMINATION:  VITAL SIGNS:  His blood pressure is 142/74, the  pulse was 72.  His weight was 257, which is about stable.  NECK:  His neck veins were 8-10.  LUNGS:  Lungs had notable wheezing.  HEART:  His heart sounds were regular.  EXTREMITIES:  Had 2+ edema.   Interrogation of his Medtronic Maximo ICD demonstrates a P-wave of 2.5  with impedance of 589, threshold 0.75 at 0.4 with the R-wave of 10.5,  impedance of 551, threshold 1 volt at 0.4.  Battery voltage 3.19.  He is  atrial paced 15% of time, he is not ventricularly paced at all.   IMPRESSION:  1. Atrial flutter status post ablation without recurrent significant      atrial arrhythmias.  2. Congestive heart failure - Chronic - Systolic.  3. Ischemic heart disease with      a.     Previous bypass grafting.      b.     Ejection fraction of 25-30%.  4. Bradycardia and intermittent heart block.  5. Gary [name] is doing pretty well from rhythm point of view.      Certainly from breathing point of view,  I suspect that his      bronchitis is overlapping with his congestive heart failure, and I      have taken the liberty in anticipation of you seeing him next week      Gary [name], to have him increase his Lasix from 20/20 to 40/20, to see we      can help with some of his edema as well as to  help some of his      dyspnea.   We will see him again in 3 months' time for device followup.    Sincerely,      Duke Salvia, MD, [facility]  Electronically Signed    SCK/MedQ  DD: 05/03/2008  DT: 05/04/2008  Job #: [id number]   CC:    Arnoldo Hooker, MD

## 2010-12-05 NOTE — Letter (Signed)
February 02, 2008    Arnoldo Hooker, M.D.  [address]  [place], [place]   RE:  Gary [name], Gary [name]  MRN:  [id number]  /  DOB:  September 13, 1940   Dear Smitty Cords,   Gary [name] comes in today with his wife without significant complaints  of shortness of breath.  However, on further talking, he is unable to  walk more than about 3-4 minutes without getting short of breath.  His  weight is up 10 pounds in the last 3 months.  His activity is markedly  limited and he blames a lot of that on the headaches that he has had  that have persisted following the resolution of his Bell palsy.   MEDICATIONS:  1. Carvedilol 25 b.i.d.  2. Captopril 25 b.i.d.  3. Furosemide 20 b.i.d.  4. Warfarin.  5. Simvastatin.  6. Spiriva.   PHYSICAL EXAMINATION:  VITAL SIGNS:  His blood pressure was 126/88 with  a pulse of 65.  His weight was as noted.  LUNGS:  Clear, but were diminished.  NECK:  Neck veins were 8-9 cm.  HEART:  Sounds were regular.  The extremities had 1+ edema.   Interrogation of his Medtronic ICD demonstrates an R-wave of 9.9 with  impedance of 494, threshold 1 volt at 0.4.  The P-wave was 2.4,  impedance of 551, threshold 1.25 at 0.4.  Battery voltage is 3.20.   IMPRESSION:  1. Atrial flutter, status post ablation.  2. Sinus node dysfunction.  3. Ischemic cardiomyopathy.  4. Status post implantable cardioverter-defibrillator for the above.  5. Progressive obesity.  6. Class III heart failure.  7. Narrow QRS.   Gary [name], Gary [name], is doing okay, but I suspect that he will worsen over  months, and certainly years, if his weight gain does not change.  We had  a lengthy discussion today about the importance of controlling his diet,  which currently consists of chips, fried foods, and sweets plus anything  else he wants.  We will see him again in 3 months' time.  I have asked  him when he sees you next month, Bruce, to see if he can lose 5 pounds  by then.     Sincerely,      Duke Salvia, MD, [facility]  Electronically Signed    SCK/MedQ  DD: 02/02/2008  DT: 02/03/2008  Job #: [id number]

## 2010-12-05 NOTE — Assessment & Plan Note (Signed)
[facility]                         ELECTROPHYSIOLOGY OFFICE NOTE   DAMON, HARGROVE                      MRN:          [id number]  DATE:06/24/2007                            DOB:          [birth year]    Mr. Nitta was seen today in the [facility] on June 24, 2007,  for wound check of his newly implanted Medtronic.  Model number is  [id number], Maximo II. Date of implant was June 04, 2007, for ischemic  cardiomyopathy and atrial flutter.   On interrogation of his device today, his battery voltage is 3.21 with a  charge time of 9.7 seconds.  P waves measured 1.5 mV with an atrial  capture threshold of 0.75 volts at 0.4 msec and an atrial lead impedance  of 494 ohms.  R waves measured 12.1 mV with a ventricular capture  threshold of 0.5 volts at 0.4 msec and a ventricular lead impedance of  475 ohms.  Shock impedance was 44.  There was one nonsustained episode  since implant date.  His underlying rhythm today was 73 beats per  minute, sinus rhythm.  Atrial pacing is 24.2% with ventricular pacing at  0.2%.  No changes were made in his parameters today.   He had already removed the Steri-Strips.  His wound is without redness  or edema, and he will be seen back again in February.      Altha Harm, LPN  Electronically Signed      Duke Salvia, MD, [facility]  Electronically Signed   PO/MedQ  DD: 06/24/2007  DT: 06/24/2007  Job #: [id number]

## 2010-12-05 NOTE — Assessment & Plan Note (Signed)
[facility]                                 ON-CALL NOTE   CJ, EDGELL                        MRN:          [id number]  DATE:10/26/2007                            DOB:          March 09, 1941    TELEPHONE CONTACT NOTE.   PRIMARY CARDIOLOGIST:  Dr. Sherryl Manges   Gary [name] is a 70 year old male with an ICD and a history of atrial  flutter.  Today he stated he ate a fish sandwich and felt little ill and  was dry-heaving when he felt a shock.  There was only one shock.  He was  not having any palpitations, chest pain or shortness of breath before it  occurred.  He was not having any dizziness or presyncope.  After the  shock, the nausea and dry-heaving resolved and he states that now he  feels much better.  He is still asymptomatic.  It is the first time this  defibrillator has fired since it was inserted.   I advised Mr. Woulfe that if he felt well he did not have to come to the  hospital tonight, but if his defibrillator fired again he would have to  call 9-1-1 and come to the emergency room.  He was in agreement with  this as a plan of care.  I will also call the office and leave a message  for him to be contacted to come in for ICD interrogation next week.      Theodore Demark, PA-C  Electronically Signed      Duke Salvia, MD, [facility]  Electronically Signed   RB/MedQ  DD: 10/25/2007  DT: 10/26/2007  Job #: [id number]

## 2010-12-05 NOTE — Progress Notes (Signed)
[facility] OFFICE NOTE   Gary [name], Gary [name]                      MRN:          [id number]  DATE:10/30/2007                            DOB:          05/31/1941    Mr. Yeung is  a patient of Dr. Odessa Fleming who returns today for follow-up  after having received ICD discharge several days ago.  The patient  states that he was feeling very poorly and then all of the sudden got  shocked and then he felt better. He notified our office and is here  today for additional evaluation.  He denies chest pain.  He does  continue to smoke cigarettes.  He wheezes occasionally. He has had no  worsening shortness of breath however.  His medical problems include a  history of atrial flutter status post ablation, he has ischemic  cardiomyopathy with congestive heart failure, class 2-3 with a narrow  QRS. He has a history of intermittent bradycardia.  He has dyslipidemia.   MEDICATIONS:  1. Carvedilol 25 twice a day.  2. Captopril 25 twice a day.  3. Furosemide 20 twice a day.  4. Warfarin as directed.  5. Simvastatin 40 a day.  6. Pantoprazole 40 a day.  7. Multiple vitamins.  8. Advair.   PHYSICAL EXAMINATION:  He is a pleasant, obese, middle-aged man in no  acute distress.  The blood pressure was 166/80, the pulse was 60 and regular,  respirations were 18-22,  the weight was 250 pounds.  HEENT:  Normocephalic and atraumatic.  NECK:  Revealed no jugular distention.  No thyromegaly.  Trachea is  midline.  LUNGS:  Clear bilaterally except for a scattered wheeze in the right  upper chest field.  There was no increased work of breathing. There are  no rhonchi or rales noted.  CARDIOVASCULAR:  Distant with a regular rate and rhythm and normal S1  and S2.  There is a soft systolic murmur at the left lower sternal  border. There was no obvious gallop.  ABDOMEN:  Obese, nontender, nondistended.  There is no  organomegaly. The  bowel sounds are present. There is no rebound or guarding.  EXTREMITIES:  Demonstrated trace peripheral edema.   Interrogation of his defibrillator demonstrates a recent intercurrent IC  shock. Review of the electrogram demonstrates that the underlying atrial  rhythm was atrial fib which transitioned into atrial flutter and the  patient went for rapid ventricular response to one-to-one flutter with a  very rapid ventricular response with subsequent successful  defibrillation.  There was no change in the QRS morphology for the  event, during the event or after the shock. Antitachycardic pacing did  not terminate the patient's arrhythmia.   His P and R waves were 3 and 10 respectively today, the impedance 532 in  the A and 494 in the V.  The battery voltage was 3.18 volts.  He was 12%  A paced, less than 1% V paced.   IMPRESSION:  1. Ischemic cardiomyopathy.  2. Congestive heart failure.  3. Paroxysmal A fib and flutter with recent ICD discharge secondary to      rapid atrial  flutter with a one-to-one AV conduction.   DISCUSSION:  The patient has had one ICD shock in the last 4 months. I  have reprogrammed his device to minimize unnecessary ICD shocks,  but if  he does go back into 1/1 flutter then he will receive the shock.  The  patient had had a flutter ablation in the past. It is unclear whether  his recurrence of flutter represents a second atrial flutter or  recurrence of the same but mostly he has A fib  and for this reason I  have recommended a period of watchful waiting.  Adding digoxin would be  a consideration in the future though at the present time I think we can  hold off on this.     Doylene Canning. Ladona Ridgel, MD  Electronically Signed    GWT/MedQ  DD: 10/30/2007  DT: 10/30/2007  Job #: [id number]   cc:   Rhae Lerner, MD

## 2010-12-05 NOTE — Letter (Signed)
September 09, 2007    Arnoldo Hooker, MD  [address]  [place], [place] [address]   RE:  FREEDOM, PEDDY  MRN:  [id number]  /  DOB:  1940/08/04   Dear Smitty Cords:   Mr. Scheel came in today for ICD followup.  He is doing pretty well.  His breathing is short, but stable.   He notes some weakness in his chest wall following his SLAP, and that is  the biggest issue.   His medications include:  1. Coreg 12.5.  2. Captopril 25 b.i.d.  3. Furosemide.  4. Warfarin.  5. Spiriva.  6. Simvastatin.  7. Advair.   EXAMINATION:  His blood pressure was mildly elevated at 143/75.  His  weight was 252.  LUNGS:  Were clear.  HEART:  Sounds were regular.  EXTREMITIES:  Had no edema.   Interrogation of his Medtronic ICD demonstrates a P wave of 2 and  impedance of 532, threshold of 0.5 and 0.4 in both chambers.  The R wave  was 12 with an impedance of 494.  Variables was 3.21.  He was atrial  paced throughout 10% of the time, and interestingly, his pacing activity  index is increased by about 50% over the last couple of months.   IMPRESSION:  1. History of atrial flutter status post ablation.  2. Ischemic heart disease with a prior coronary artery bypass graft.      Ejection fraction 25% to 30%.  3. Congestive heart failure class III/chronic obstructive pulmonary      disease.  4. Bradycardia and intermittent heart block.   Mr. Isidore is doing well.  I have taken the liberty, Bruce, to up  titrate his Coreg from 12.5 to 18.75.  Hopefully, this will not  aggravate his COPD.   I have asked that he follow up with you sooner rather than later.    Sincerely,      Duke Salvia, MD, [facility]  Electronically Signed    SCK/MedQ  DD: 09/09/2007  DT: 09/10/2007  Job #: [id number]   CC:    Corliss Parish

## 2010-12-05 NOTE — Letter (Signed)
Dec 15, 2008    Arnoldo Hooker, MD  [address]  [place], [place] [address]   RE:  KHIZAR, FIORELLA  MRN:  [id number]  /  DOB:  1941-06-26   Dear Smitty Cords:   Mr. Bossman comes in today in followup for his ICD implanted for primary  prevention in the setting of ischemic heart disease.  He has had in the  past inappropriate therapy.  His major complaint is ongoing fatigue and  shortness of breath.  He has lost a few pounds.  Review of his diet  demonstrates significant ambient salt intake in sausage, bacon, bread,  cheeses, etc.  Also when he saw Jonny Ruiz he tried to decrease his captopril,  this turned out not work so well, it was increased back again.  This may  have been done for relatively low blood pressure.   MEDICATIONS:  Now include the  1. Captopril 25 b.i.d.  2. Carvedilol 25 b.i.d.  3. Warfarin.  4. Furosemide 20/20.  5. Tapazole.  6. Advair.   PHYSICAL EXAMINATION:  His blood pressure is 112/69.  His pulse was 86.  He was in no acute distress.  Weight was 253, which is down 7 pounds  since last July. His lungs are clear.  His heart sounds were regular.  Neck veins were flat.  Abdomen was protuberant.  Extremities had 1+  edema.   Interrogation of his Medtronic ICD demonstrates a impedance in the A of  589, in the ventricle 532.  P-waves is 1.8, R-waves 11.8.  There has  been no intercurrent therapies.  He has a little bit of atrial  fibrillation as detected by his monitor.   IMPRESSION:  1. Paroxysmal atrial fibrillation.  2. History of ischemic heart disease with depressed left ventricular      function and prior bypass surgery.  3. Narrow QRS.  4. Congestive heart failure - acute-on-chronic - systolic.  5. Morbid obesity.   We have discussed again Bruce, with Mr. Carp and his wife the  importance of diet modification.  I thought I would try a Lasix up  titration trail taking him from 20/20 to 20/20 alternating with 20/40  for total duration of 2  weeks.  We will see if that does not have some  impact on his breathing.  I have asked him follow up with you after  that.   Thanks very much for allowing [place] to participate in his care.    Sincerely,      Duke Salvia, MD, [facility]  Electronically Signed    SCK/MedQ  DD: 12/15/2008  DT: 12/16/2008  Job #: [id number]   CC:    Corliss Parish, MD

## 2010-12-08 NOTE — Op Note (Signed)
NAME:  Gary [name], KUHNER                         ACCOUNT NO.:  [id number]   MEDICAL RECORD NO.:  [id number]                   PATIENT TYPE:  INP   LOCATION:  2302                                 FACILITY:  [facility]   PHYSICIAN:  Etter Sjogren, M.D.                  DATE OF BIRTH:  10-08-1940   DATE OF PROCEDURE:  09/10/2003  DATE OF DISCHARGE:                                 OPERATIVE REPORT   PREOPERATIVE DIAGNOSIS:  Mediastinal wound infection, status post  sternal  debridement and previous pectoralis myocutaneous flaps.   POSTOPERATIVE DIAGNOSIS:  Mediastinal wound infection, status post  sternal  debridement and previous pectoralis myocutaneous flaps.   PROCEDURE:  1. Incision and drainage of mediastinal infection.  2. Reelevation of pectoralis muscle flaps bilaterally.  3. Conversion of myocutaneous flaps to isolated muscle flaps bilaterally     and readvancement of pectoralis muscle flaps to close the space in the     mediastinum bilaterally.   SURGEON:  Etter Sjogren, M.D.   ANESTHESIA:  General.   ESTIMATED BLOOD LOSS:  100 mL.   DRAINS:  There were 5 Blake drains left.   INDICATIONS FOR PROCEDURE:  The patient is a 70 year old man who had sternal  dehiscence status post  CABG. He had repair of this over 3 weeks ago. He had  this with myocutaneous advancement pectoralis flaps. He had done well for  over 3 weeks and then yesterday developed purulent drainage from his  remaining Blake tubes. He was hospitalized. He was found to have fever and  had gram-positive cocci in the drainage. He was taken to surgery today  for  the drainage and readvancement of the pectoralis muscle flaps. This  procedure was discussed  with him in great detail and he understood the  nature of the procedure and the risks and possible complications as well as  possible loss of the muscle flaps, repeat infection, anesthesia related  complications and wished to proceed.   DESCRIPTION OF PROCEDURE:  The  patient was taken to the operating room  and  placed in the supine position. After a discussion of the risks of general  anesthesia he was prepped with Betadine and draped with sterile drapes.   The old midline incision was opened. The underlying muscle flaps were found  to be healthy and viable. Leaving them where they had been sutured together,  the skin was then elevated off of the pectoralis muscle flaps bilaterally on  the superficial aspect. This was carried out to the axillary region.   This having been done, the pectoralis muscle flaps were then separated at  the midline where they had been sutured together. The underlying space was  then drained and thoroughly cleansed, curetted and irrigated with saline.  The space underneath the muscle flaps also was thoroughly curetted and  cleaned and irrigated with pulse lavage. A total of around 6 liters of  saline  was used to the pulse lavage system. He was also irrigated with 1  liter of antibiotic solution.   After confirming excellent hemostasis, the elevation of the muscle flaps was  continued. They were freed inferiorly. They were freed along the clavicle,  preserving the thoracoacromial vessels. Counter incisions were made at the  axillary region and the muscles were released from the humeral insertion  bilaterally  in order to allow them to advance freely into the mediastinal  space and drop into that space, closing the space.   Again thorough irrigation with saline, meticulous hemostasis with  electrocautery. Blake drains were positioned, 1 in each lateral aspect, one  at the mid aspect and 1 deep to the muscle flaps in the mediastinum. The  muscle flaps were advanced, the right one being carried into the superior  half of the wound and allowed to fill that aspect, and the left one advanced  inferiorly and closing the inferior aspect. These were secured in place with  3-0 Monocryl simple interrupted sutures as well as an  occasional 0 PDS  suture.   Excellent hemostasis having been confirmed, the 0 PDS was used to close the  midline  wound and then skin staples and 3-0 Monocryl interrupted inverted  deep  sutures  with skin staples for the axillary wounds. Antibiotic  ointment and a dry sterile dressing were applied.   The patient tolerated the procedure well. The total operative time for this  procedure was 3 hours.                                               Etter Sjogren, M.D.    DB/MEDQ  D:  09/10/2003  T:  09/11/2003  Job:  [id number]

## 2010-12-08 NOTE — H&P (Signed)
NAME:  Gary [name], ROSENBERGER                         ACCOUNT NO.:  [id number]   MEDICAL RECORD NO.:  [id number]                   PATIENT TYPE:  INP   LOCATION:  2013                                 FACILITY:  [facility]   PHYSICIAN:  Salvatore Decent. Dorris Fetch, M.D.         DATE OF BIRTH:  1940-10-04   DATE OF ADMISSION:  09/09/2003  DATE OF DISCHARGE:                                HISTORY & PHYSICAL   CHIEF COMPLAINT:  Fevers, chills, nausea and vomiting.   HISTORY OF PRESENT ILLNESS:  [name] is a very pleasant 70 year old male  who is status post coronary artery bypass grafting in December of 2004 and  subsequent sternal debridement and pec flap closure in January of 2005.  The  patient is very well-known to the CVTS service because of this and was just  recently discharged from our service on September 06, 2003.  The patient was  discharged to home with two J-P's in place from his bilateral pec flap  closure.  The patient was scheduled to see Dr. Odis Luster in clinic next week  for evaluation of possible removal of the J-P drains.  At the time of  discharge from our service, the patient was afebrile.  His vital signs were  stable, and he was doing quite well, without any signs of infection.  The  patient states he was doing fairly well over the last several days until  last evening.  He states that he ate his dinner, and had nausea and  vomiting.  He then experienced fevers, chills, and night sweats.  He broke  out in a rash with welts and felt somewhat shortness of breath.  He noticed  some increased drainage from around the J-P sites that included yellowish,  purulent drainage.  He stated that, as well, the J-P drainage fluid changed  from a clear serous fluid to a more cloudy, brownish fluid overnight.   The patient had been visited daily by a home health nurse, who described to  the patient and his wife that he had no urgent medical issues or findings  that were abnormal.   The patient  states that he has not taken any different medications from what  he was discharged home on.  He has not changed any of his dietary or  activity habits.  He states that he was feeling very well, with no issues or  problems, until the above described symptoms last evening.  The patient  denies any change in bowel or urine habits.  He denies any chest pain, PND,  orthopnea, palpitations.  The patient denies any vision changes or hearing  changes.  He denies any neurological changes such as paresthesias, myalgias  or weakness.  He denies any cough productive of sputum, wheezes.  The  patient has been ambulating without difficulty in his home, and has been  tolerating a regular diet otherwise.   PAST MEDICAL HISTORY:  1. Coronary artery disease status  post coronary artery bypass grafting     July 22, 2003 by Dr. Charlett Lango.  2. History of postoperative atrial fibrillation, controlled with amiodarone     and Lopressor.  3. Hypertension.  4. Dyslipidemia.  5. Diabetes mellitus, type 2, which was diagnosed at the time of admission     in December of 2004.  6. Status post sternal debridements on August 07, 2003, and subsequent     reoperation with bilateral pectoralis major flap closure on August 12, 2003, by Dr. Odis Luster.  7. Urinary retention, controlled with Flomax.   ALLERGIES:  No known drug allergies.   MEDICATIONS:  1. Flomax 0.4 mg p.o. q.d.  2. Glucotrol XL 10 mg p.o. q.d.  3. Amiodarone 200 mg p.o. b.i.d.  4. Toprol XL 25 mg p.o. q.d.  5. Zocor 20 mg p.o. q.d.  6. Aspirin 325 mg p.o. q.d.  7. Lasix 40 mg p.o. q.d.  8. K-Dur 20 mEq p.o. q.d.  9. Tylox one to two tablets p.o. q.3-4h. p.r.n. pain.   FAMILY HISTORY:  Noncontributory.   SOCIAL HISTORY:  The patient lives in [place], [place], with his  wife Darel Hong.  They have three children.  The patient is disabled due to his  heart disease.  The patient has a history of tobacco abuse, which he quit on   his admission, December 2004.  The patient does not drink alcohol.   REVIEW OF SYSTEMS:  [name], in general, states that he has been doing  fairly well.  Please see HPI for pertinent positives and negatives.  Patient  continues to wear eyeglasses and dentures.  Otherwise, a 12-point review of  systems is negative.   PHYSICAL EXAM:  VITAL SIGNS:  His temperature is 101.0 degrees Fahrenheit,  pulse is 110 and regular, respirations are 22, blood pressure is 114/43,  SPO2 is 94% on room air.  GENERAL:  [name] is a very pleasant 70 year old gentleman who is sitting  comfortably in a hospital bed, and is somewhat tachypneic.  Otherwise, he  appears in no acute distress.  HEENT:  EOMI.  Oral mucosa is pink and moist without lesions or exudate.  The patient is edentulous at this point in the exam.  He does usually wear  upper and lower dentures.  NECK:  Full range of motion, no thyromegaly or lymphadenopathy.  LUNGS:  Coarse rhonchi and inspiratory wheezes.  Otherwise, clear.  HEART:  Tachycardic, regular rhythm without murmur, rub or gallop.  ABDOMEN:  Soft, nontender, nondistended with good breath sounds.  GENITOURINARY:  Deferred.  EXTREMITIES:  1+ pitting edema bilaterally, bilateral lower extremities.  Otherwise they are warm and dry.  SKIN:  Appears somewhat icteric with a patch of erythema on his chest and  upper abdomen, as well as a rash on the lateral thighs.  There is no other  evidence of skin change.  INCISIONS:  His sternal incision is stable.  His sternum is stable.  His  incision is clean, dry, and intact, with Steri-Strips intact.  The patient  has two J-P drains out of the abdomen.  There is purulent yellow drainage  coming from around each of the J-P drains.  The J-P drain fluid is brownish  to yellowish purulence in nature.  The patient's chest tube sites are completely healed, and his lower extremity site from saphenous vein harvest  is completely healed.  The  patient has no other areas of erythema or  drainage.   PERTINENT  LABS/DATA:  CBC drawn at the time of admission to the ER is as  follows:  WBC of 19.1, hemoglobin of 12.6, hematocrit of 38.4, platelets of  239.  A BMET completed at the time of admission to ER is as follows:  Sodium  is 134, potassium 4.4, chloride of 100, CO2 of 28, BUN of 31, and glucose of  141.  J-P drain fluid cultures are pending, as are blood cultures x 3.  A CT  scan of the chest was completed during this dictation, and the final results  are still pending.  A P, A and lateral chest x-ray was obtained at the time  of admission to the ER, for which I do not have the final results at the  time of this dictation.   IMPRESSION:  This is a very pleasant 70 year old male who is status post  sternal debridement and pectoral flap closure after coronary artery bypass  grafting in December of 2004.  He unfortunately likely has an infection of  his sternal reconstruction.   PLAN:  We will admit Mr. Crago to [facility] under Dr. Dorris Fetch  for further evaluation, including IV antibiotics.  We will consult Dr.  Odis Luster of plastic surgery to assist in management.  We will continue all of  the patient's home medications.  Further management and plan will be  followed through once the results of the abovementioned studies are  completed.      Carolyn A. Eustaquio Boyden.                  Salvatore Decent Dorris Fetch, M.D.    CAF/MEDQ  D:  09/09/2003  T:  09/09/2003  Job:  [id number]   cc:   Etter Sjogren, M.D.  [address]  [address]  [place]  [place] [address]  Fax: [phone]

## 2010-12-08 NOTE — Discharge Summary (Signed)
NAME:  Gary [name], Gary [name]                         ACCOUNT NO.:  [id number]   MEDICAL RECORD NO.:  [id number]                   PATIENT TYPE:  INP   LOCATION:  2018                                 FACILITY:  [facility]   PHYSICIAN:  Kerin Perna, M.D.               DATE OF BIRTH:  [birth year]   DATE OF ADMISSION:  08/06/2003  DATE OF DISCHARGE:  09/05/2003                                 DISCHARGE SUMMARY   ADMISSION DIAGNOSIS:  Sternal wound infection.   PAST MEDICAL HISTORY:  1. Coronary artery disease, originally diagnosed in 1991 after cardiac     catheterization.  The patient was treated medically until December 2004.     He presented with a myocardial infarction and underwent repeat cardiac     catheterization in [place].  He was then transferred to [facility] where he underwent coronary artery bypass grafting by Dr.     Dorris Fetch on July 22, 2003.  2. Postoperative atrial fibrillation which was treated with amiodarone,     digoxin, and Lopressor.  3. Hypertension.  4. Dyslipidemia.  5. Diabetes mellitus, type 2, diagnosed at admission in December 2004.   ALLERGIES:  No known drug allergies.   DISCHARGE DIAGNOSES:  1. Coronary artery disease status post coronary artery bypass grafting in     December 2004.  2. Atrial fibrillation, controlled with amiodarone and Lopressor.  3. Hypertension.  4. Dyslipidemia.  5. Diabetes mellitus, type 2.  6. Sternal wound infection status post sternal debridement x 2 and bilateral     pectoralis myocutaneous flap creation.  7. Urinary retention.   BRIEF HISTORY:  The patient is a 70 year old Caucasian male who presented to  the emergency department at [facility] on August 06, 2003, for  evaluation of sternal bleeding and pain.  He noted drainage from his sternal  incision the morning of admission after a bout of coughing.  The patient had  a mildly obstructive cough the last few days prior to admission.  He  had  also had some chest wall discomfort on the left side.  The patient was  evaluated in the emergency department by Dr. Kathlee Nations Trigt.  Dr. Zenaida Niece  Trigt's examination revealed that the sternum was unstable, and there was  erythema present over the lower aspect of his incision. There was no  drainage at that time.  The chest x-ray revealed that the sternal wires were  intact, but that at least two in the central sternum had migrated.  Dr. Donata Clay admitted the patient to [facility] for antibiotic therapy  with vancomycin, a CT scan of the chest, and probable sternal wound  debridement.   Dr. Laneta Simmers saw the patient on hospital day #2 and reviewed the CT scan.  CT  scan of the chest showed gross sternal separation and substernal fluid  collection from the __________ to the xiphoid.  There was significant  erythema along the way.  The patient was not toxic, but Dr. Laneta Simmers felt that  needed to be drained immediately.  This was discussed with the patient, and  he was subsequently taken to the OR on August 07, 2003.   The patient was taken to the OR on August 07, 2003, for sternal debridement  by Dr. Evelene Croon.  Aerobic and anaerobic cultures were taken at that  time, and there was no gross purulence noted.  All of the wires were pulled  to the left side of the sternum which was essentially destroyed.  A wound  VAC was subsequently applied.  The patient tolerated the procedure well and  was hemodynamically stable immediately postoperatively.  He was extubated  without problem and awoke from anesthesia neurologically intact.   On postoperative day #2, the OR cultures were noted to be negative.  The  patient had been continued on a wound VAC.  The patient had been continued  on vancomycin and Avelox.  Dr. Odis Luster, of plastic surgery, was consulted on  August 09, 2003, regarding possible muscle flap closure.  After evaluation  of the patient, it was Dr. Odis Luster' opinion that the  patient should undergo  the use of pectoralis muscle flap to close this wound.   On postoperative day #3, the patient was doing well, and all cultures had  been negative.  Therefore, the vancomycin was discontinued, and the Avelox  was continued.   On August 11, 2003, the patient was instable condition; however, he was  constipated and having difficulty passing urine.  The patient was started on  Flomax and had a Foley placed for probable BPH.   The patient was again taken to the OR on August 12, 2003, for sternal  debridement by Dr. Donata Clay and bilateral pectoralis myocutaneous flap by  Dr. Odis Luster.  The patient tolerated the procedure well and was  hemodynamically stable in the immediate postoperative period.  The patient  was extubated without problem and woke up from anesthesia neurologically  intact.  The patient was started on tube feeds on postoperative day #1.   By postoperative day #2, the patient's respiratory status was noted to be  marginal with respiratory rate of 33, PCO2 of 63, and a pH of 7.25.  The  chest x-ray showed decreased lung volume and mild edema.  The patient was  alert and responsive with a strong cough.  The patient was placed on BiPAP  and was transfused 1 unit packed red blood cells for hematocrit of 25.  The  patient's tube feeds were held secondary to the respiratory status at that  time.  The patient was subsequently restarted on tube feeds on postoperative  day #3.   On postoperative day #3, the patient was noted to be in atrial fibrillation  with a heart rate of 140.  The patient was started on IV amiodarone and IV  Cardizem.  The patient had a wet cough with increased secretions and was on  Lasix and nebulizers.  The patient was subsequently started on Humibid.   A [facility] electrophysiology consult was obtained on August 16, 2003,  regarding the patient's persistent atrial fibrillation.  After evaluation of the patient, it was [place] Muta Sauro's  opinion that the patient should continue  with the amiodarone and beta blocker with digoxin for the time being.  The  patient had not converted on IV medications and, therefore, was converted by  [facility] EP service on August 17, 2003.  The patient returned to normal  sinus rhythm.   The J-P drains continued to function well postoperatively.  On January 25,  the patient was noted to have phlebitis in the left forearm.  The IV was  taken out, and it was treated with heat due to the patient already being on  vancomycin and Avelox.  The patient tolerated cardiac rehab postoperatively  well.   On August 18, 2003, the patient again complained of urinary retention and,  therefore, the Foley was reinserted.  Approximately 550 ml of dark yellow  urine was obtained.   On August 20, 2003, postoperative day #14 and #8, the patient was  complaining of a lot of pain from the Foley catheter with some erosion of  the urethral opening.  Therefore, the Foley was discontinued to see if the  patient could pass urine on his own.  The Foley was subsequently replaced  secondary to voiding trouble.  It was more comfortable at that time.   Due to the patient's continuing problem with urinary retention, Dr. Annabell Howells,  of the urology service, was consulted on August 23, 2003.  It was his  impression that the urinary retention could possibly be secondary to BPH  with failed voiding trials on Flomax, or that the patient could possibly  have a bladder stretch injury with his initial episode of retention.  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  Dr. Annabell Howells recommended that the patient continue on  Flomax 0.4 mg daily 1 to 2, and he would want to see the patient for  followup after discharge for a voiding trial and possible cystoscopy.   The patient continued to progress well, and the sternal drainage began to  decrease. The patient's Foley  catheter was discontinued on August 30, 2003,  after speaking with Dr. Annabell Howells.  It was his opinion that the patient should  have the Foley discontinued and only reinserted if he was unable to void.  If the patient was able to void, he would only need followup with Dr. Annabell Howells  p.r.n.  If the patient had to have the Foley reinserted, he would schedule  followup appointment with him after discharge.  The patient has been able  void without problem.   On September 02, 2003, the patient's diabetes mellitus was under suboptimal  control.  Therefore, the patient's Glucotrol was increased to 10 mg daily,  and the sliding scale insulin was continued.   On September 03, 2003, Dr. Odis Luster noted the patient was without complaint.  He was afebrile.  The wound looked good.  Drains were functioning well, and  the wall suction was discontinue.  The J-P drains were placed back  ____________.  If that was adequate, Dr. Odis Luster felt that the patient should probably be ready for discharge on Monday.  Subsequently, the patient has  Ace wrap placed fro compression by  Dr. Odis Luster.   On September 05, 2003, the day prior to discharge, the patient was  complaining of mild pain under the right axilla due to the Ace wrap.  He was  afebrile, and the vital signs were stable.  On physical exam, cardiac was  regular rate and rhythm.  The lugs were clear to auscultation bilaterally.  The abdomen was soft, nontender, nondistended.  There were positive bowel  sounds.  The extremities had 1+ pitting edema bilaterally in the lower  extremities.  The wounds were healing well, and the  drainage had decreased  from the sternal wound.  Dr. Odis Luster evaluated the patient and felt that he  would investigate compression vest for the patient.  As long as the patient  remained in stable condition, it was felt he would be ready for discharge  within the next one to two days.   LABORATORY DATA:  BMP on September 05, 2003:  Sodium 140,  potassium 3.7, BUN  29, creatinine 1.9, glucose 62.   CONDITION ON DISCHARGE:  Improved.   DISCHARGE MEDICATIONS:  1. Flomax 0.4 mg p.o. daily.  2. Glucotrol XL 10 mg p.o. daily.  3. Amiodarone 200 mg p.o. b.i.d.  4. Toprol XL 25 mg p.o. daily.  5. Zocor 20 mg p.o. daily.  6. Aspirin 325 mg p.o. daily.  7. Lasix 40 mg p.o. daily x 7 days.  8. K-Dur 20 mEq p.o. daily x 7 days.   PAIN MANAGEMENT:  Tylox 1 to 2 p.o. q.4-6h. p.r.n. pain.   ACTIVITY:  No driving,no strenuous activity, and the patient to continue  daily walking exercises.   DIET:  Low-salt, low-fat, low cholesterol, and diabetic restriction.   WOUND CARE:  Home care [facility].  The patient is to get in contact with RN  to provide wound care at home.  The number is [phone].   FOLLOW UP:  1. Dr. Donata Clay approximately three weeks after discharge.  CVTS office     will contact the patient with this appointment.  2. [facility] one hour before appointment with Dr. Donata Clay. The patient is to bring chest x-ray with him to the appointment     with Dr. Donata Clay.  3. Dr. Odis Luster.  The patient should contact him to discuss followup     appointment.  4. Dr. Annabell Howells one to two weeks after discharge.  The patient should call him     for appointment at [phone].      Pecola Leisure, [place]                      Kerin Perna, M.D.    AY/MEDQ  D:  09/05/2003  T:  09/05/2003  Job:  [id number]   cc:   Etter Sjogren, M.D.  [address]  [address]  [place]  [place] [address]  Fax: [phone]   [name], M.D.  [address]  [place]  [place] [address]   Salvatore Decent. Dorris Fetch, M.D.  [address]  [place]  [place] [address]

## 2010-12-08 NOTE — Consult Note (Signed)
NAME:  Gary [name], Gary [name]                         ACCOUNT NO.:  [id number]   MEDICAL RECORD NO.:  [id number]                   PATIENT TYPE:  INP   LOCATION:  2018                                 FACILITY:  [facility]   PHYSICIAN:  Excell Seltzer. Annabell Howells, M.D.                 DATE OF BIRTH:  02-15-1941   DATE OF CONSULTATION:  08/23/2003  DATE OF DISCHARGE:                                   CONSULTATION   CHIEF COMPLAINT:  Urinary retention.   HISTORY:  This is a 70 year old white male with history of coronary artery  disease, diabetes mellitus, COPD, and hypertension, who was readmitted for  sternal wound infection on August 06, 2003, following coronary artery  bypass grafting on July 22, 2003.  The patient denies any prior GU  history and was voiding well prior to coronary bypass graft with the  exception of some chronic frequency and nocturia, but he developed urinary  retention this admission and has failed two voiding trials despite Flomax.  He does report chronic urinary frequency and nocturia but was an undiagnosed  diabetic.  He denies any symptoms of neuropathy.   ALLERGIES:  No known drug allergies.   CURRENT MEDICATIONS:  1. Glipizide 5 mg daily.  2. Atrovent 0.5 nebulizer q.6h.  3. Tobramycin ophthalmic drops twice daily.  4. Flomax 0.4 mg daily.  5. Protonix 40 mg daily.  6. Guaifenesin 1200 mg twice daily.  7. Enoxaparin 0.3 ml q.24h.  8. Amiodarone 200 mg q.12h.  9. Colace 200 mg daily.  10.      Lopressor 12.5 mg twice daily.  11.      Avelox 400 mg daily.  12.      Potassium chloride 20 mEq daily.  13.      Ventolin 2.5 mg q.6h.  14.      Lasix 80 mg daily.  15.      Vancomycin 1 g q.24h.  16.      Tramadol p.r.n.  17.      Oxycodone p.r.n.   PAST MEDICAL HISTORY:  1. Coronary artery disease with recent coronary artery bypass grafting.  2. Hyperlipidemia.  3. Hypertension.  4. COPD.  5. Diabetes.   PAST SURGICAL HISTORY:  1. Coronary artery bypass grafting.  2. Debridement of sternal wound infection.  3. Myocutaneous pectoralis flaps done recently.   SOCIAL HISTORY:  He was a smoker for 50+ years, quit three weeks ago.  He  denies alcohol.   FAMILY HISTORY:  Unremarkable.   REVIEW OF SYSTEMS:  He has some constipation.  He has some upper respiratory  congestion.  He denies any hematuria.  He denies significant chest pain,  although he does have some wound discomfort.  He denies significant swelling  in his feet or ankles.  He denies any numbness in his toes or feet.  He has  had hemorrhoids.  He has reported some dizziness.  He  has shortness of  breath with exertion.  He has some stiffness in his knee.  He wears glasses  and has dentures.  He is otherwise without complaints.   PHYSICAL EXAMINATION:  VITAL SIGNS:  Heart rate 82, blood pressure 126/56.  He is afebrile.  GENERAL:  Well-developed, obese white male in no acute distress, alert and  oriented x 3.  HEENT:  Normocephalic and atraumatic.  NECK:  Supple with a central line on the right.  CHEST: Reveals recent median sternotomy which has been showing no evidence  of infection.  He has multiple chest wall drains.  HEART:  Regular rate and rhythm.  LUNGS:  Bilateral breath sounds with some rhonchi.  ABDOMEN:  Soft and obese without mass, hepatosplenomegaly, or tenderness.  No hernias are noted.  No inguinal adenopathy is noted.  GU:  Exam reveals an unremarkable phallus with Foley in meatus.  Scrotum has  slight edema.  Testicles are bilaterally descended, normal in size and  consistency without mass or tenderness.  Epididymis are unremarkable.  Anus  and perineum without lesions.  RECTAL:  Normal sphincter tone.  Prostate 2+ in size, without nodule.  Seminal vesicles not palpable.  No rectal masses are noted.  EXTREMITIES:  Full range of motion.  There is some bruising of the ankles  bilaterally.  Mild edema.  NEUROLOGIC:  Grossly intact.  SKIN: Warm and dry.   LABORATORY DATA:   Urine culture from January 29 is no growth.  BMP from  January 30 is unremarkable with the exception of minimal elevation in  glucose of 182 and a calcium of 8.  CBC from January 27 shows white count  9.4, hemoglobin 9.8.  Urinalysis on January 15 is clear.  Urinalysis on  January 29 had 3 to 6 white cells, 3 to 6 red cells, nitrite negative.   On his last Foley catheter insertion on January 26, he had a return of 550  ml.  I do not see a more recent residual urine.   IMPRESSION:  1. Urinary retention possibly secondary to benign prostatic hypertrophy with     failed voiding trials on Flomax.  It is possible he could have bladder     stretch injury with his initial episode of retention, or he could have     component of diabetic cystopathy.  2. Diabetes.  3. Coronary artery disease.  4. Chronic obstructive pulmonary disease.  5. Hypertension.  6. Sternal wound infection.   RECOMMENDATIONS:  I would continue the Flomax 0.4 mg daily 1 to 2.  I am  going to have the patient follow up with me in the office on August 30, 2003, for a voiding trial and possible cystoscopy.  I think his bladder  needs additional rest with possible stretch injury.                                               Excell Seltzer. Annabell Howells, M.D.    JJW/MEDQ  D:  08/23/2003  T:  08/23/2003  Job:  [id number]   cc:   [name], M.D.  [address]  [place]  [place] [address]

## 2010-12-08 NOTE — H&P (Signed)
NAME:  Gary [name], Gary [name]                         ACCOUNT NO.:  [id number]   MEDICAL RECORD NO.:  [id number]                   PATIENT TYPE:  INP   LOCATION:  4730                                 FACILITY:  [facility]   PHYSICIAN:  Salvatore Decent. Dorris Fetch, M.D.         DATE OF BIRTH:  03-28-1941   DATE OF ADMISSION:  08/06/2003  DATE OF DISCHARGE:                                HISTORY & PHYSICAL   PRIMARY CARE PHYSICIAN:  Dr. Karleen Hampshire.   CARDIOLOGIST:  Dr. Zachery Dauer, [place], [place].   CHIEF COMPLAINT:  Bleeding from my chest today.   HISTORY OF PRESENT ILLNESS:  Gary [name] is a 70 year old Caucasian man who  presented to the emergency department at [facility] today for  evaluation of sternal bleeding  and pain.  Gary [name] noted drainage from  his sternal incision this morning after a bout of coughing. He has had a  mildly productive cough the last few days. He also has had some chest wall  discomfort on the left side.  He was evaluated in the emergency department  by Dr. Kathlee Nations Trigt.  Dr. Zenaida Niece Trigt's examination revealed the sternum to  be unstable, and there is erythema over the lower aspect of his incision.  There was no drainage at that time.   Chest x-ray revealed that the sternal wires were intact, but at least two in  the central sternum have migrated.  Dr. Zenaida Niece Tright's recommendation was for  admission for IV antibiotic therapy, chest CT scan and probably sternal  wound debridement.  Gary [name] agreed with this plan.   PAST MEDICAL HISTORY:  1. Coronary artery disease originally diagnosed in 1991 after     catheterization.  He was treated medically until December of 2004.  He     presented with a myocardial infarction and underwent cardiac     catheterization in  and was transferred to [facility].     On December 30, he underwent coronary artery bypass grafting with Dr.     Viviann Spare C. Hendrickson.  2. He had postoperative atrial  fibrillation and was treated with amiodarone,     digoxin and Lopressor.  3. Hypertension.  4. Dyslipidemia.  5. Diabetes mellitus type 2, diagnosed at his admission in December of 2004.   ALLERGIES:  This patient has no known drug allergies.   MEDICATIONS:  Prior to admission:  1. Enteric-coated aspirin 325 mg daily.  2. Lopressor 25 mg b.i.d.  3. Digoxin 0.125 mg daily.  4. Altace 2.5 mg daily.  5. Amiodarone 200 mg b.i.d.  6. Zocor 20 mg daily.  7. Glucotrol XL, 5 mg daily.  8. Tylenol p.r.n.   SOCIAL HISTORY:  Gary [name] in married.  He and his wife Darel Hong have three  children.  He is disabled due to from [employer] due to his heart  disease.  He stopped using tobacco at the time of his admission December  of  2004.  He does not drink alcohol.  He lives with his family.  He will have  assistance available after discharge from the hospital.   REVIEW OF SYSTEMS:  Gary [name] states in general, he has been doing okay  since discharge from the hospital.  He has had no headaches.  No difficulty  with chewing or swallowing.  He still wears eyeglasses and dentures.  However, he has left his dentures at home today.  He has had no dyspnea on  exertion.  As stated above, he has had a mildly productive cough with yellow  sputum.  No chest pain except for the chest wall discomfort as stated above.  No pedal edema.  He has had no GI complaints except his appetite has been  decreased for the last two days.  No change in his bowel habits.  The last  bowel movement was January 13.  No GU complaints.  He does have some  occasional dizziness when he first gets up.  No fainting, no falls.  He is  ambulating independently.  He does not use any devices.  He states that he  has not been sleeping well.   PHYSICAL EXAMINATION:  VITAL SIGNS:  Blood pressure 113/60, temperature  97.4, height 5 feet, 7 inches, weight 202 pounds.  GENERAL:  Gary [name] is a 70 year old Caucasian man, in no acute  distress,  resting comfortably in his hospital bed, eating his lunch.  He is pleasant  and cooperative with this exam.  HEENT:  Eyes:  EOMI.  Oral mucosa is pink and moist.  He is edentulous.  NECK:  Full range of motion, no carotid bruits, no thyromegaly or  lymphadenopathy.  LUNGS:  His breathing is unlabored.  His breath sounds are clear  bilaterally.  He does have a cough which is productive of pale yellow  sputum.  HEART:  Irregular rate and rhythm, no murmurs.  ABDOMEN:  Rotund with bowel sounds that are soft and nontender.  EXTREMITIES:  His lower extremities are without edema or varicosities.  There are some venous stasis skin changes.  He has 1+ dorsalis pedis pulses  bilateral.  NEUROLOGIC:  He is alert and oriented.  Cranial nerves II-XII are grossly  intact.  His gait was not assessed today.   LABORATORY DATA:  On admission, August 06, 2003, CBC:  White blood cells  12.0, hemoglobin 10.6, hematocrit 32.2, platelets 614,000.  Chemistries  include sodium 139, potassium 4.4, chloride 103, CO2 30, BUN 17, creatinine  1.5, glucose 102.  PT 15.9, INR 1.4.  PTT 37.   Wound culture is pending.  Blood cultures are also pending.  CT scan of the  chest is pending.   IMPRESSION:  This is a 70 year old man status post coronary artery bypass  graft discharged from [facility] on July 28, 2003, now with  sternal wound infection.   PLAN:  Admit to [facility] for:  1. IV antibiotics therapy with vancomycin.  2. CT scan of the chest  3. Probably sternal wound debridement on Monday, August 09, 2003.      Toribio Harbour, N.P.                  Salvatore Decent Dorris Fetch, M.D.    CTK/MEDQ  D:  08/06/2003  T:  08/06/2003  Job:  [id number]   cc:   Salvatore Decent. Dorris Fetch, M.D.  [address]  [place]  [place] [address]

## 2010-12-08 NOTE — Consult Note (Signed)
NAME:  MARTON, MALIZIA                         ACCOUNT NO.:  [id number]   MEDICAL RECORD NO.:  [id number]                   PATIENT TYPE:  INP   LOCATION:  2041                                 FACILITY:  [facility]   PHYSICIAN:  Doylene Canning. Ladona Ridgel, M.D.               DATE OF BIRTH:  1941-03-06   DATE OF CONSULTATION:  10/05/2003  DATE OF DISCHARGE:                                   CONSULTATION   ELECTROPHYSIOLOGIC CONSULTATION:   REQUESTED BY:  Salvatore Decent. Dorris Fetch, M.D.   INDICATION FOR CONSULTATION:  Evaluation of prolonged ventricular  tachycardia which was asymptomatic.   HISTORY OF PRESENT ILLNESS:  The patient is a 70 year old man with known  coronary artery disease status post bypass surgery several months ago.  The  patient ultimately had severe coughing episodes resulting in fracture and  displacement of his sternotomy wires.  He underwent initial closure with  flap placement in January 2005 but then developed overt muscle flap  infection and presented back to the emergency room back in February with  fevers, chills, and staph sepsis and purulent sternum.  He has subsequently  undergone a second revision.  He has been on vancomycin.  The patient has  had paroxysmal atrial fibrillation after his bypass surgery, sometimes with  controlled ventricular rate, sometimes with uncontrolled ventricular rates,  but all of which have been fairly asymptomatic.  Two days ago the patient  developed an episode of sustained monomorphic VT lasting nearly 40 seconds.  This had a cycle length of 380 Msec.  As noted there were no hemodynamic  symptoms or palpitations noted.  It terminated spontaneously.  He is now  referred for additional evaluation and treatment.   PAST MEDICAL HISTORY:  Additional past medical history is notable for  hypertension, he has a history of obesity, he has a history of  hyperlipidemia.  He has no significant history of syncope or near syncope.  He also has a history  of adult-onset diabetes.   FAMILY HISTORY:  Noncontributory.   SOCIAL HISTORY:  The patient resides in [place] and he is married.  He  quit smoking in 2004 prior to his bypass surgery.  He denies alcohol abuse.   REVIEW OF SYSTEMS:  Notable for recent episodes of fevers and chills; he  denies any vision or hearing problems; he denies any skin rashes or lesions;  he denies shortness of breath, chest pain, PND, or orthopnea.  He denies  palpitations or claudication.  He denies arthralgias or arthritis; he denies  dysuria, hematuria, or nocturia; he denies numbness, depression, or anxiety  but does have generalized weakness.  He denies nausea, vomiting, diarrhea,  or constipation.  He denies polyuria, polydipsia, heat or cold intolerance.   PHYSICAL EXAMINATION:  GENERAL:  He was a pleasant well-appearing 70-year-  old man who looks older than his stated age.  VITALS:  Blood pressure was 112/75, the pulse was 120  and irregularly  irregular, the respirations were 20, temperature was 97.8.  HEENT:  Normocephalic and atraumatic.  The pupils equal and round.  The  oropharynx was moist.  The sclerae were anicteric.  NECK:  No jugular venous distention; there was no thyromegaly; the trachea  was midline.  CARDIOVASCULAR:  Irregularly irregular tachycardia without murmur.  The  heart sounds somewhat distant.  LUNGS:  The lungs were clear bilaterally to auscultation.  There were very  minimal rales in the left base.  ABDOMEN:  There were drains in place with minimal amounts of serosanguineous  fluid.  Abdominal exam was soft, nontender, nondistended; there was no  organomegaly.  EXTREMITIES:  No cyanosis or clubbing; there was 1+ edema in the lower  extremities bilaterally.   ELECTROCARDIOGRAM:  EKG demonstrates sinus rhythm with prior inferior MI.   IMPRESSION:  1. Ischemic cardiomyopathy with precatheterization ejection fraction of 40%.  2. Coronary disease status post bypass  surgery.  3. Sternal wound infection with Staphylococcus aureus as well as     Staphylococcus aureus bacteremia and septicemia.  4. Postoperative atrial fibrillation on amiodarone and Coumadin.  5. Hypokalemia.  6. Sustained ventricular tachycardia.   DISCUSSION:  The patient's VT was asymptomatic but lasted greater than 30  seconds.  It was in association with hypokalemia.  In addition he has had  persistent atrial fibrillation with rapid ventricular response.  I have  recommended that he continue on Coumadin and increase his dose of  amiodarone.  We will plan to obtain a 2-D echo to see where his LV function  is.  If his LV function has remained at 40 or less would hold off on  additional procedures, i.e., ICD insertion, as his risk for infection is  quite high.  We will plan to follow the patient with you.  Ultimately he may  end up requiring ICD insertion unless his LV function has improved  postoperatively.                                               Doylene Canning. Ladona Ridgel, M.D.    GWT/MEDQ  D:  10/05/2003  T:  10/07/2003  Job:  [id number]   cc:   Lamar Blinks, M.D.  [address]  [place], [place] [address]

## 2010-12-08 NOTE — Op Note (Signed)
NAME:  Gary [name], Gary [name]                         ACCOUNT NO.:  [id number]   MEDICAL RECORD NO.:  [id number]                   PATIENT TYPE:  INP   LOCATION:  2301                                 FACILITY:  [facility]   PHYSICIAN:  Etter Sjogren, M.D.                  DATE OF BIRTH:  [birth year]   DATE OF PROCEDURE:  08/13/2003  DATE OF DISCHARGE:                                 OPERATIVE REPORT   PREOPERATIVE DIAGNOSIS:  Sternal dehiscence status post coronary artery  bypass graft.   POSTOPERATIVE DIAGNOSIS:  Sternal dehiscence status post coronary artery  bypass graft.   PROCEDURE PERFORMED:  Sternal debridement including bone and bilateral  pectoralis myocutaneous flap reconstruction.   SURGEON:  Etter Sjogren, M.D.   ASSISTANT:  Kerin Perna, M.D., for the debridement.   ESTIMATED BLOOD LOSS:  450 mL.   DRAINS:  Three Blake drains left.   CLINICAL NOTE:  70 year old man status post CABG the last week of December.  He had some coughing at home and had some pain in his sternum.  He developed  some redness and suffered a sternal dehiscence.  He was debrided about a  week ago at the time of admission and sent home on a VAC.  He now presents  for reconstruction.  This was discussed in great detail on two separate  occasions with him as well as his family.  The risks and possible  complications were discussed which included but were not limited to  bleeding, infection, anesthesia complications, wound healing problems,  scarring, loss of skin, loss of muscle flaps, recurrence of the  mediastinitis and recurrence of wound healing problems, as well as pulmonary  embolus, pneumonia, and death.  He understood these risks and possible  complications and wished to proceed.   DESCRIPTION OF PROCEDURE:  The patient was taken to the operating room and  placed supine.  After successful induction of general anesthesia, he was  prepped with Betadine and draped with sterile drapes.  The sternal  bone  debridement was performed using a rongeur.  Dr. Donata Clay was present.  The  curet was used for the base of the mediastinum staying off the heart,  itself.  The soft tissues were then excised completely.  Good hemostasis was  with electrocautery.  The wound was irrigated with the pulse lavage system  using a total of 3 liters of saline.  A chest tube was placed on the right  hand side by Dr. Donata Clay since he did appear to have a leak on that side  and an air pocket.   After changing gloves, the myocutaneous flaps were elevated.  These were  elevated deep to the pectoralis major muscle.  The dissection was carried  inferiorly and the rectus fascia was lifted with the muscle from extensive  distance, taking care to avoid damage to the underlying rectus muscle.  Superiorly, the thoracoacromial vessels  were identified.  Dissection was  carried out around them.  The pectoralis muscle was divided off the clavicle  leaving the clavicular head and this was released back to the level of the  thoracoacromial vessels.  There were a couple of veins that bled a little  bit superiorly that were probably thymus veins and these were identified and  ligated using 3-0 silk or 4-0 Prolene suture ligatures.  I elevated both of  the flaps.  The flaps were then elevated across the midline and found to  reach without any difficulty.  Thorough irrigation with antibiotic solution  containing vancomycin antibiotic.  Inspection of the donor areas revealed  that there was excellent hemostasis.  The drains were positioned, 19 [place]  Blake drains brought through separate stab wounds inferiorly, one on each  side of the chest where the muscles had been harvested, and another one in  the mediastinum.  These were brought through separate stab wounds inferiorly  and secured with 3-0 Prolene sutures.  The flaps were advanced across the  midline and secured with #2 Vicryl figure-of-eight sutures.  The skin wound   was irrigated with antibiotic solution and the skin closure with 2-0 Vicryl  interrupted deep sutures and skin staples.  There was no compromise,  whatsoever, on the skin edges.  These tissues appeared to be very healthy.  Antibiotic ointment and dry, sterile dressings were applied and he was  transported back to the intensive care unit stable.                                               Etter Sjogren, M.D.    DB/MEDQ  D:  08/13/2003  T:  08/13/2003  Job:  [id number]

## 2010-12-08 NOTE — Op Note (Signed)
Gary [name], PROWSE               ACCOUNT NO.:  [id number]   MEDICAL RECORD NO.:  [id number]          PATIENT TYPE:  OIB   LOCATION:  2899                         FACILITY:  [facility]   PHYSICIAN:  Duke Salvia, M.D.  DATE OF BIRTH:  1941-06-17   DATE OF PROCEDURE:  11/22/2004  DATE OF DISCHARGE:                                 OPERATIVE REPORT   PREOPERATIVE DIAGNOSES:  1.  Atrial flutter.  2.  Severe cardiomyopathy, question rate-related.  3.  Prior bypass surgery with a sternectomy and a muscle flap.   POSTOPERATIVE DIAGNOSES:  1.  Atrial flutter.  2.  Severe cardiomyopathy, question rate-related.  3.  Prior bypass surgery with a sternectomy and a muscle flap.   PROCEDURES:  1.  Direct current cardioversion.  2.  Electrophysiological study with arrhythmia mapping, with radiofrequency      catheter ablation.   Following the obtaining of informed consent, the patient was brought to the  electrophysiology laboratory and placed on the fluoroscopic table in a  supine position.  After routine prep and drape, cardiac catheterization was  performed with local anesthesia and conscious sedation.  Noninvasive blood  pressure monitoring, transcutaneous oxygen saturation monitoring and end-  tidal CO2 monitoring were performed continuously throughout the procedure.  Following the procedure, the catheters were removed, hemostasis was  obtained, and the patient was then transferred to the floor in stable  condition.   Catheters:  A 5 French quadripolar catheter was inserted via the left  femoral vein to the AV junction to measure the His electrogram.  A 7 French  duodecapolar catheter was inserted via the left femoral vein to the  tricuspid annulus.  A 6 French octapolar catheter was inserted via the right  femoral vein to the coronary sinus.  An 8 French 8 mm deflectable-tip  ablation catheter was inserted via the right femoral vein via an SL2 sheath  to mapping sites in the posterior  septal space.  Please note also that the  His catheter was manipulated to the right ventricular apex for right  ventricular pacing.   RESULTS:   SURFACE ELECTROCARDIOGRAM AND BASIC INTERVALS:  Rhythm:  Atrial flutter.  RR:  528 msec.  AA:  264 msec.  PR interval:  N/A.  QRS:  92 msec.  QT interval:  290 msec.  P-wave duration:  N/A.  A/H:  N/M.  HV:  N/M.  HV:  N/M.   Final rhythm:  Sinus.  Cycle length:  962 msec.  PR interval:  192 msec.  QRS duration:  98 msec.  QT interval:  381 msec.  P-wave duration:  134 msec.  AH interval:  87 msec.  HV interval:  54 msec.  __________:  N/M.   AV NODAL FUNCTION:  AV Wenckebach was 400 msec.  VA conduction was dissociated at 600 msec.  The AV nodal effective refractory period at a paced cycle length of 600 msec  was 300 msec, representing the functional refractory period of the atrium.  AV nodal conduction was continuous.   ACCESSORY PATHWAY FUNCTION:  No evidence of an accessory pathway was  identified.   VENTRICULAR RESPONSE TO PROGRAMMED STIMULATION:  Normal for V-Stim as  described above.   ARRHYTHMIAS INDUCED:  The patient presented to the lab in atrial flutter.  Entrainment mapping and electrogram mapping confirmed counterclockwise  rotation across the tricuspid isthmus with pacing confirming this from both  the distal coronary sinus and the tricuspid annulus.   Radiofrequency applied between the tricuspid annulus and the inferior vena  cava, unfortunately, was not able to terminate conduction across the  isthmus.  I then chose to cardiovert the patient.  He received a 100-joule  shock synchronously into atrial flutter, terminating atrial flutter and  restoring sinus rhythm.   At this point electrogram mapping using double potentials and moving the  catheter from a 5 o'clock position to a 6 to 7 o'clock position allowed [place]  to create bidirectional isthmus block.   FLUOROSCOPY TIME:  A total of 24 minutes 49 seconds  of fluoroscopy were  utilized at 7-1/2 frames per second.   A total of 19 minutes 59 seconds of radio wave energy was applied in  multiple applications between the tricuspid annulus and the inferior vena  cava as noted.  We had to move to a more lateral orientation to find the  place where trans-isthmus conduction persisted.  On the more medial aspect,  there is a significant ridge just past the tricuspid annulus, and it was  hard to burn there and eliminate the conduction, hence the alternative  strategy.   IMPRESSION:  1.  Normal sinus function.  2.  Abnormal atrial function manifested by sustained atrial flutter.      Successful cardioversion and then cavo-tricuspid isthmus block was      accomplished with radiofrequency, eliminating the substrate for the      patient's tachycardia.  3.  Normal AV nodal function.  4.  Normal His-Purkinje system function.  5.  No accessory pathway.  6.  Normal ventricular response to programmed stimulation.   SUMMARY AND CONCLUSION:  The results of electrophysiological testing confirm  cavo-tricuspid isthmus-dependent flutter as the mechanism underlying Mr.  Betke' atrial arrhythmia.  This has been associated with 2:1 conduction and  a cardiomyopathy, which we are hoping is going to be related to the rate and  will improve with correction of this rate.   We will continue him on Coumadin.  He will continue on his other  medications.  In the event that his LVEF does not improve, he will be  considered for ICD implantation.      SCK/MEDQ  D:  11/22/2004  T:  11/22/2004  Job:  [id number]   cc:   Arnoldo Hooker, M.D.  [facility]   Electrophysiology Laboratory

## 2010-12-08 NOTE — Consult Note (Signed)
NAME:  TOLUWANI, YADAV                         ACCOUNT NO.:  [id number]   MEDICAL RECORD NO.:  [id number]                   PATIENT TYPE:  INP   LOCATION:  2301                                 FACILITY:  [facility]   PHYSICIAN:  Etter Sjogren, M.D.                  DATE OF BIRTH:  March 19, 1941   DATE OF CONSULTATION:  08/09/2003  DATE OF DISCHARGE:                                   CONSULTATION   REQUESTING PHYSICIAN:  Dr. Kathlee Nations Trigt.   CHIEF COMPLAINT:  Open sternal wound.   HISTORY OF PRESENT ILLNESS:  [age]-year-old man had a CABG in late  December, last week of December.  He had done well initially but last week  had a coughing spell and had some drainage from his sternal wound.  He was  seen and it was felt that he had had a sternal dehiscence.  Exploration in  the operating room did reveal that the sternal wires had cut through the  left side of the sternum completely and he underwent a debridement and the  sternum was left open with a V.A.C. in place.  Plastic surgery consultation  was requested for management options.   PAST MEDICAL HISTORY:  His past medical history is:  1. Positive for coronary artery disease, as stated.  2. Positive for diabetes mellitus, type 2.  3. Positive for emphysema.  4. Negative for renal.  5. Negative GI.   REVIEW OF SYSTEMS:  His review of systems is positive for dyslipidemia,  otherwise, negative.   SOCIAL HISTORY:  He is married, lives locally and does have a history of  tobacco abuse, at least a 60-pack-year smoking history, and he has not  smoked since his heart bypass surgery the last week of December.   PHYSICAL EXAMINATION:  CHEST:  Physical examination is limited to the chest.  He does have an open wound of his sternum.  This is clean.  The V.A.C. is  changed at the bedside today.   ASSESSMENT:  Today, we have discussed the options, long-term option to use  the vacuum-assisted closure, although the wound may or may not close  and he  would be left with an open wound for an extensive period of time.  Another  option would be to try to use the pectoralis muscle flaps to close the  wound.  This surgery does carry some risks and we discussed those with him  including bleeding, infection, anesthesia-related complications, wound-  healing problems, loss of tissue.  He would like for me to talk over all of  this with his family and he will try to arrange to have them here either  later today or tomorrow, which we will be happy to do, before he makes any  type of final decision.  Etter Sjogren, M.D.    DB/MEDQ  D:  08/09/2003  T:  08/09/2003  Job:  [id number]   cc:   [name], M.D.  [address]  [place]  [place] [address]

## 2010-12-08 NOTE — Discharge Summary (Signed)
NAMELAEL, PILCH               ACCOUNT NO.:  [id number]   MEDICAL RECORD NO.:  [id number]          PATIENT TYPE:  OIB   LOCATION:  3705                         FACILITY:  [facility]   PHYSICIAN:  Duke Salvia, M.D.  DATE OF BIRTH:  01/24/1941   DATE OF ADMISSION:  11/22/2004  DATE OF DISCHARGE:  11/23/2004                                 DISCHARGE SUMMARY   DISCHARGE DIAGNOSES:  1.  History of persistent typical atrial flutter, rapid ventricular rate.  2.  Discharging [facility] post-procedure day #1 status post      transesophageal echocardiogram identifying no left atrial appendage      thrombus and radiofrequency catheter ablation with creation of      bidirectional block at the cavotricuspid isthmus.  3.  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  4.  Ongoing Coumadin therapy.   SECONDARY DIAGNOSES:  1.  History of ischemic cardiomyopathy. Adenosine Cardiolite study January      2006:  Ejection fraction 21% with fixed anterior and inferior perfusion      defect consistent with prior infarct and/or scar. No evidence of      ischemia.  2.  History of myocardial infarction x2, his last in December 2004.  3.  Severe three vessel coronary artery disease status post coronary artery      bypass graft surgery December 2004.  4.  Return after discharge post-coronary artery bypass grafting with sternal      wound infection requiring sternectomy and muscle flap wound closure.  5.  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  6.  Diabetes, probable existent many years, discovered at hospitalization      for coronary artery bypass.  7.  Hypertension.  8.  Dyslipidemia.  9.  Congestive heart failure class III symptoms with dyspnea when patient      walks from room to room.  10. Obstructive sleep apnea. The patient has a BiPAP diagnosed 1 year ago.  11. Past surgery  history including tonsillectomy as well as coronary artery      bypass graft surgery, and two wound revisions with muscle flap closure.   PROCEDURE:  On Nov 22, 2004, radiofrequency catheter ablation of typical  atrial flutter with creation of bidirectional cavotricuspid isthmus block by  Dr. Sherryl Manges. The patient tolerated the procedure well maintaining sinus  rhythm in the 24 hour period post-procedure. His right groin at the  catheterization site is without swelling or bruit.   DISCHARGE MEDICATIONS:  1.  Digitek 0.125 mg daily.  2.  Lasix 40 mg daily.  3.  Potassium chloride 20 mEq daily.  4.  Actos 45 mg daily.  5.  Glipizide 10 mg twice daily.  6.  Toprol-XL 100 mg daily.  7.  Captopril 25 mg twice daily  8.  Diltiazem ER 180 mg daily.  9.  Lovastatin 20 mg daily at bedtime.  10. Protonix 40 mg daily.  11. Multivitamin daily.  12. Spiriva Handihaler one inhalation daily.  13. Albuterol rescue inhaler two puffs as needed.  14. Coumadin 2 mg daily.  15. New medication:  Enteric-coated aspirin 325 mg daily for the next 6      weeks. The patient is to take aspirin as a precaution against clotting      at the small ablation scars in the endocardium.  16. Note:  If the patient is to have dental work up through October 2006,      even just a teeth cleaning, is to call [phone] for antibiotic coverage.   DISCHARGE DIET:  Low-sodium, low-cholesterol diet.   DISCHARGE INSTRUCTIONS:  The patient may shower. He is to call [phone] if  he experiences swelling or increased pain at the catheterization site.   FOLLOWUPArnoldo Hooker, M.D. at the [facility] on 12/04/04  at 1:45 p.m. He was to have an echocardiogram at the [facility] on  Thursday, 6/15 at 8:45 a.m. We will ask him to hold his Toprol that morning  and he will see Dr. Graciela Husbands in August 2006. Dr. Odessa Fleming office will call with  that appointment.   BRIEF HISTORY:  Mr. Wamser is a 70 year old male with  a history of ischemic  heart disease with ischemic cardiomyopathy status post coronary artery  bypass graft surgery in December 2004. This was complicated by sternal wound  infection which required sternectomy as well as muscle flap closure with  redo of muscle flap closure for reinfection. He has prominent persistent  atrial flutter with rapid ventricular response. He has significant exercise  intolerance and his ejection fraction is 21%. Apparently, his post-coronary  artery bypass graft surgery ejection fraction was much better and there was  some concern that his cardiomyopathy may be rate related. The patient has  seen Dr. Graciela Husbands in consultation. The risks and benefits of radiofrequency  catheter ablation have been discussed with the patient and the patient  wishes to proceed. It is possible that his ejection fraction will improve  after radiofrequency catheter ablation. If note, the patient is certainly  qualified for placement of a cardioverter defibrillator with ischemic  cardiomyopathy, previous history of myocardial infarction, and significant  congestive heart failure symptoms.   HOSPITAL COURSE:  The patient presented to [facility] on May 3. He  underwent successful radiofrequency catheter ablation the same say by Dr.  Sherryl Manges as dictated above. He has had no post-procedure complications  either at the catheterization site or with cardiac dysrhythmia. He is  maintaining sinus rhythm and says he feels much better. The patient  discharged on the medication and followup as dictated.      GM/MEDQ  D:  11/23/2004  T:  11/23/2004  Job:  [id number]   cc:   Arnoldo Hooker, MD  [facility], [place]

## 2010-12-08 NOTE — Op Note (Signed)
NAME:  Gary, [name]                         ACCOUNT NO.:  [id number]   MEDICAL RECORD NO.:  [id number]                   PATIENT TYPE:  INP   LOCATION:  2309                                 FACILITY:  [facility]   PHYSICIAN:  Salvatore Decent. Dorris Fetch, M.D.         DATE OF BIRTH:  1941/01/30   DATE OF PROCEDURE:  07/22/2003  DATE OF DISCHARGE:                                 OPERATIVE REPORT   PREOPERATIVE DIAGNOSIS:  Severe three vessel coronary disease with unstable  angina.   POSTOPERATIVE DIAGNOSIS:  Severe three vessel coronary disease with unstable  angina.   PROCEDURE:  Median sternotomy, extracorporeal circulation, coronary artery  bypass grafting x5 (left internal mammary artery to LAD, saphenous vein  graft to first diagonal, sequential saphenous vein graft to obtuse marginal  1 and 2, saphenous vein graft to acute marginal), endoscopic vein harvest  both thighs, open harvest both lower legs.   SURGEON:  Salvatore Decent. Dorris Fetch, M.D.   ASSISTANT:  Toribio Harbour, N.P.   ANESTHESIA:  General.   FINDINGS:  Poor quality vein required harvest of majority of vein from both  legs to have adequate conduit, diagonal vein not long enough to reach to  ascending aorta placed as Y graft off the side of the sequential graft to  obtuse marginals 1 and 2, acute marginal 1 mm poor quality target, remaining  targets fair quality.  Significant bullous emphysema in the left lung.   CLINICAL NOTE:  Mr. Gary [name] is a 70 year old gentleman with a history of  heavy tobacco abuse, noninsulin-dependent diabetes and hypertension who  presented with unstable angina and at catheterization had left main and  severe three vessel coronary disease.  He was referred for coronary artery  bypass grafting, the indications, risks, benefits and alternatives were  discussed in detail with the patient, he understood and accepted the risks  and agreed to proceed.   OPERATIVE NOTE:  Mr. Gary [name] was brought to  the preop holding area on  July 22, 2003, there he was noted to have significant wheezing  bilaterally, he was treated with bronchodilators prior to surgery.  Lines  were placed to monitor arterial, venous and pulmonary arterial pressure, EKG  leads were placed for continuous telemetry, intravenous antibiotics were  administered.  The patient was taken to the operating room, anesthetized and  intubated, a Foley catheter was placed, the chest, abdomen and legs were  prepped and draped in the usual fashion.   A median sternotomy was performed and the left internal mammary artery was  harvested in standard fashion, it was a 2 mm good quality conduit,  simultaneously an incision was made in the medial aspect of the right leg,  the greater saphenous vein graft was harvested endoscopically from the right  thigh, this initially appeared to be of good quality but there were multiple  sclerotic areas, additional vein was harvested from the upper right calf and  this also became very  small caliber at that area.   The pericardium was opened, the ascending aorta was inspected and palpated,  there was no palpable atherosclerotic disease.  The aorta was cannulated via  concentric two Ethibond pledgeted pursestring sutures, a dual-stage venous  cannula was placed via pursestring sutures in the right atrial appendage,  cardiopulmonary bypass was instituted and the patient was cooled to 32  degrees Celsius.  The coronary arteries were inspected and anastomotic sites  were chosen.  The sites were measured for length of vein grafts.  The  conduit was inspected.  The mammary was of good quality and was adequate  length.  With the vein there was inadequate length and additional vein had  to be harvested from the left thigh endoscopically.  A segment of this was  of much better quality than the right side however, at the knee again it  became small and then very sclerotic.  It was explored at the ankle but  was  unusable at that location as well.  After finding adequate quality vein  adequate for doing the grafts a foam pad was placed in the pericardium, a  temperature probe was placed in myocardial septum and a cardioplegic cannula  was placed in the ascending aorta.   The aorta was cross-clamped.  The left ventricle was emptied via the aortic  root.  Then cardiac arrest was achieved with a combination of cold antegrade  blood cardioplegia and topicalized saline.  After achieving a complete  diastolic arrest and myocardial subtemperature of 10 degrees Celsius the  following distal anastomoses were performed.   First, a reverse saphenous vein graft was placed sequentially to obtuse  marginals 1 and 2.  Obtuse marginal 1 was subtotally occluded, obtuse  marginal 2 was totally occluded proximally, both vessels were of good  quality at the site of the anastamosis, both had some disease distal in the  vessel as well.  A side-to-side anastamosis was performed to OM-1 and an end-  to-side was performed to OM-2.  Both were probed proximally to distally to  ensure patency prior to tying the sutures.  There was good flow to the  graft, cardioplegia was administered, and there was good hemostasis.   Next, a short segment of reverse saphenous vein was anastomosed end-to-side  to the first diagonal branch of the LAD, this was a large anterolateral  branch and had approximately a 90% proximal stenosis, it was 1.5 mm and of  good quality at the site of the anastamosis.  The anastamosis was performed  with a running 7-0 Prolene suture.  There was good flow through this graft,  cardioplegia was administered, and again there was good hemostasis.   Next, a segment of reverse saphenous vein was placed end-to-side to an acute  marginal branch of the right coronary.  This branch arose over the acute  margin but traversed along the inferior wall to the distal septum.  This was the only graftable target in the  right distribution.  It was 1 mm small  target vessel.  The anastamosis was performed end-to-side with a running 7-0  Prolene suture.  There was adequate flow, cardioplegia was administered,  there was good hemostasis.   Next, the left internal mammary artery was brought through a window in the  pericardium, the distal end was spatulated, it was then anastomosed end-to-  side to the distal LAD which was a 1.5 mm thin wall but adequate quality  target.  The mammary was a good quality conduit.  The anastamosis was  performed with a running 8-0 Prolene suture.  At the completion of the  mammary to LAD anastamosis the bulldog clamp was removed to inspect for  hemostasis, it then was replaced, there was immediate and rapid warming, and  additional cardioplegia was administered down the vein grafts in the aortic  root.   The vein grafts then were cut to length.  The proximal anastomoses for the  obtuse marginal, sequential graft and the acute marginal graft were placed  to 4.0 mm punch aortotomies with running 6-0 Prolene sutures.  A  longitudinal venotomy then was made in the obtuse marginal graft but the  proximal end of the saphenous vein to the diagonal was spatulated and was  anastomosed end-to-side to this vessel with a running 7-0 Prolene suture.  The anastamosis was deaired.  The patient was placed in Trendelenburg  position.  The bulldog clamp was again removed from the left mammary artery,  immediate and rapid warming was noted.  Lidocaine was administered.  The  patient was placed in Trendelenburg position and aortic cross-clamp was  removed, residual and trapped air was aspirated from the veins and the  bulldog clamps were removed.  Total cross-clamp time was 88 minutes.   While the patient was being rewarmed the proximal and distal anastomoses  were inspected for hemostasis, a dopamine drip was initiated and epicardial  pacing wires were placed on the right ventricle and right  atrium.  Patient  initially required pacing for bradycardia.  A brief attempt was made to wean  from cardiopulmonary bypass, the patient was weaned to one half flow after  beginning lung inflation, subsequently the flows were decreased to one  quarter of calculated, the heart began to distend and before coming off  bypass the patient was placed back on full flow.  After resting for several  minutes the patient developed sinus tachycardia, the pacer was turned off,  patient then weaned from cardiopulmonary bypass without difficulty.  The  total bypass time was 219 minutes.  The initial cardiac index was greater  than 2 L/min/sq m and the patient remained hemodynamically stable throughout  the post-bypass period.   A test dose of protamine was administered and was well tolerated.  The  atrial and aortic cannulae were removed.  The remainder of the protamine was  administered without incident.  The chest was irrigated with 1 L of warm normal saline containing 1 g of vancomycin.  Hemostasis was achieved.  The  left pleural and two mediastinal chest tubes were placed through separate  subcostal incisions.  The mediastinal fat was reapproximated over the heart  to prevent adhesions to the sternum, the sternum was closed with a  combination of simple and figure-of-eight heavy gauge stainless steel wires,  the remainder of the incisions were closed in standard fashion, subcuticular  closures were used for the skin.  All sponge, needle, and instrument counts  were correct at the end of the procedure.  The patient was taken from the  operating room to the surgical intensive care unit in critical but stable  condition.                                               Salvatore Decent Dorris Fetch, M.D.    SCH/MEDQ  D:  07/22/2003  T:  07/23/2003  Job:  [id number]  cc:   Lamar Blinks, M.D.  [address]  [place], [place] [address]

## 2010-12-08 NOTE — Discharge Summary (Signed)
NAME:  Gary [name], Gary [name]                         ACCOUNT NO.:  [id number]   MEDICAL RECORD NO.:  [id number]                   PATIENT TYPE:  INP   LOCATION:  2041                                 FACILITY:  [facility]   PHYSICIAN:  Salvatore Decent. Dorris Fetch, M.D.         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   DISCHARGE DIAGNOSES:  1. Coronary artery disease status post coronary artery bypass grafting     July 22, 2003, by Salvatore Decent. Dorris Fetch, M.D.  2. Status post sternal debridement August 07, 2003, and subsequent repeat     operation with bilateral pectoralis major flap closure on August 12, 2003, by Etter Sjogren, M.D.  3. Status post incision and drainage with elevation of pectoralis muscle     flaps and conversion of myocutaneous flaps to isolated muscle flaps     bilaterally of the sternal wound by Etter Sjogren, M.D. on September 10, 2003.  4. History of postoperative atrial fibrillation.  5. Hypertension.  6. Dyslipidemia.  7. Diabetes mellitus, type 2, recently diagnosed in December 2004.  8. History of urinary retention.  9. History of chronic obstructive pulmonary disease.  10.      Coumadin therapy for atrial fibrillation.   PROCEDURES:  1. Incision and drainage of MRSA-positive sternal wound with elevation of     pectoralis muscle flap and conversion of myocutaneous flap to isolated     muscle flap bilaterally completed on September 10, 2003, by Dr. Odis Luster.  2. Long-term IV antibiotic therapy.  3. PICC line July 13, 2004.  4. Careful management of multiple chest drains per plastic.   CONSULTATIONS:  1. Cardiac rehab.  2. Case management.  3. Nutrition.   HISTORY OF PRESENT ILLNESS:  Gary [name] is a very pleasant 70 year old male  who is well known to  the CVTS service status post coronary artery bypass  grafting December 2004.  The patient subsequently developed sternal  dehiscence.  He underwent debridement and packed flat closure in January  2005.  The patient was discharged on September 06, 2003, with multiple J-P  drains still intact.  The patient planned to see Dr. Odis Luster in clinic  approximately one week from presentation to the emergency room.  He  presented on September 09, 2003, with a history of fevers, chills, nausea and  vomiting.  He stated he was doing quite well and had remained afebrile for  several days.  The J-P drains continued to drain serous fluid.  The patient  stated that on the evening prior to presentation, he ate his dinner and had  nausea and vomiting.  He then experienced fevers, chills, and night sweats.  He broke out in a rash with welts and developed shortness of  breath.  He  noted increased drainage around his J-P sites and included yellowish  purulent drainage.  He also stated that the J-P fluid drainage changed from  as clear serous fluid to a more cloudy brownish fluid overnight.  He then  decided to present to the emergency department for further evaluation of  these issues.   He was seen in the emergency department by the CVTS service, and decision  was made for him to be admitted to [facility] for further  evaluation and treatment.   HOSPITAL COURSE:  Gary [name] was admitted to [facility] on September 09, 2003, and immediately underwent CT scan of the chest.  The patient also  underwent a chest x-ray, and routine cultures were taken off the J-P  drainage fluid.  The CT scan showed findings consistent with a sternal  infection, dehiscence, and inflammatory changes in superficial soft tissues  between the dehisced sternal bone and involving the anterior aspect of the  mediastinum without large fluid collection or any gas.  The patient was seen  in consultation by Dr. Odis Luster of  plastic surgery on the day of admission.  His plan was for patient to begin IV antibiotics and undergo surgery the  following day for exploration and incision and drainage as well as repeat  culture of the sternal wound.   Gary [name] was taken to the operating room on September 10, 2003, and  underwent incision and drainage of the sternal wound, re-elevation of the  pectoralis muscle flaps, with conversion of myocutaneous flaps to isolated  muscle flaps bilaterally.  Five J-P and Blake drains were placed.  The  patient tolerated the procedure well and was transferred to the surgical  intensive care unit in stable condition.  He remained sedated and on  ventilatory support throughout his operative day evening.   Postop day #1, the patient was found to be in rapid atrial fibrillation with  a rate of about 140.  He was rebolused with amiodarone and started on a  Cardizem drip. The patient was awake and neurologically intact and deemed  appropriate for extubation from ventilatory support.  The ventilator was  weaned appropriately throughout the day, and the patient was extubated  successfully later that evening.  The patient remained overall  hemodynamically stable with good urine output.   Over the next several days while in surgical intensive care unit, the  patient remained in atrial fibrillation.  Cardizem drip was adjusted  appropriately for rate control.  The patient was started on subcutaneous  Lovenox since his atrial fibrillation was persistent and ultimately was  initiated on oral Coumadin therapy.  Otherwise, the patient's incisions  remained stable, clean, dry, and intact.  His drains were functioning well  with moderate drainage.  He was continued on IV antibiotic therapy.  Intraoperative cultures returned with the final diagnosis of methicillin-  resistant Staphylococcus aureus wound infection.  The patient's blood cultures were also positive for MRSA, and appropriate contact  precautions  were initiated.   During this time, the patient was initiated on cardiac rehab and was  ambulating as tolerated in the hallways.  A PICC line was placed in  anticipation of long-term IV antibiotic therapy.   Finally on postop day #5, or September 15, 2003, the patient was transferred  to the stepdown unit.  He remained in a rate controlled atrial fibrillation,  and his invasive lines had been discontinued.  He was on oral medications  for rhythm control.  Throughout his stay on the stepdown unit, the patient  remained in rate-controlled atrial fibrillation except for a 48-hour window  of normal sinus rhythm.  He remained asymptomatic and hemodynamically stable  in rate-controlled atrial fibrillation and was treated appropriately with  p.o. medications only.  The J-P drains continued to be monitored and managed  appropriately by Dr. Odis Luster and the plastic surgery team.  The patient  continued to quite well, participating in cardiac rehab and ambulating long  distances with only one assistant.   He continued to have pulmonary issues with his history of COPD.  He had some  wheezes and coarse rhonchi and was started on albuterol and Atrovent  nebulizers.  The patient also was very diligent with his pulmonary toilet,  incentive spirometer, and flutter valve.  Diabetes was managed with sliding  scale insulin only and Glucotrol once daily.  Overall, he continued to do  fairly well from a surgical standpoint.   Plans were initiated then in anticipation of transfer to subacute care unit  for further management on September 20, 2003.  At the time of that  evaluation, there were no beds available in the SACU, and the patient was  placed on a waiting list.  Once a bed was available, the plans were again  initiated in anticipation of discharge to the SACU.   On September 29, 2003, discharge planning was initiated in anticipation for  transfer to the subacute unit on September 30, 2003.  At the  time of planning,  the patient's vital signs were as follows.  He was afebrile, and blood  pressure was 90 to 100s/50s to 70s.  His heart rate was 100.  His  respirations were 20.  O2 saturation 92 to 95% on room air.  He weighed 212  pounds.  His heart was in regular rhythm in rate-controlled atrial  fibrillation on telemetry.  His lungs revealed diffuse wheezes inspiratory  and expiratory and coarse rhonchi anteriorly.  Abdomen was soft, nontender,  nondistended, with bowel sounds.  He had 1+ pitting ankle edema which was  much Improved from prior exam.  He was wearing knee-high TED hose.  Sternal  incision was stable with staples intact, no evidence of erythema, warmth, or  drainage.  His five J-P drains were intact and functioning well with good  suction and drainage.  The patient was on day #19 of 42 of IV vancomycin  therapy.  Sputum sample was sent for culture secondary to patient stating increasing cough and sputum production overnight.  The patient continued to  receive Xopenex and Atrovent nebulizer treatments four times a day with good  result.  A PA and lateral chest x-ray completed on September 29, 2003, showed no  increase in pulmonary edema or pleural effusion.  The patient had stable  bibasilar atelectasis.   The patient's PT/INR were 18/0 and 1.8, respectively.  Increased his  Warfarin dose to 5 mg p.o. daily for a goal INR of 2.0 to 2.5 for atrial  fibrillation.   Overall, the patient is doing very well.  He is tolerating a regular diet.  He had no bowel or bladder problems.  He was ambulating in the hallways with  cardiac rehab  regularly without difficulty.  His incisions were stable  without evidence of infection.   We will continue as planned with transfer to the subacute care unit on September 30, 2003, pending available beds and no change in patient's clinical status.   DISCHARGE MEDICATIONS:  On SACU  admission;  1. Flomax 0.4 mg p.o. q.h.s.  2. Zocor 20 mg p.o.  q.h.s.  3. Amiodarone 40 mg p.o. b.i.d.  4. Colace 100 mg p.o. b.i.d.  5. Dulcolax 10 mg p.o. daily.  6. Vancomycin 1 g IV q.24h.  7. Humibid LA 1200 mg p.o. b.i.d.  8. Xopenex nebulizer four times a day per respiratory therapy.  9. Atrovent nebulizer four times a day per respiratory therapy.  10.      Toprol XL 50 mg p.o. daily.  11.      K-Dur 40 mEq p.o. daily.  12.      Diltiazem 240 mg p.o. daily.  13.      Glucotrol 5 mg p.o. daily.  14.      Multivitamins 1 tablet p.o. daily.  15.      Lasix 80 mg p.o. q.a.m. and 40 mg p.o. q.p.m.  16.      Oxycodone 5 mg 1 to 2 tablets p.o. q.4-6h. p.r.n. for pain.  17.      Phenergan 12.5 mg to 25 mg p.o. or p.r. or IV q.6h. p.r.n. for     nausea.  18.      Milk of magnesia 30 ml p.o. q.6h. p.r.n. for constipation.  19.      Senokot S 1 or 2 tablets p.o. q.h.s. p.r.n. for constipation.  20.      Maalox 30 ml p.o. q.4-6h. p.r.n. for indigestion.  21.      Ambien 5 to 10mg  p.o. q.h.s. p.r.n. for sleep.   ALLERGIES:  No known drug allergies.   DISCHARGE INSTRUCTIONS:  1. Activity:  The patient is to participate in physical and occupational     therapy and advance as tolerated in subacute care unit.  He is to     ambulate in the hallways with assistance.  The patient is to avoid     lifting greater than10 pounds.  2. Diet:  The patient is to continue to follow carbohydrate-modified medium-     calorie diet.  3. Wound Care:  The patient is to wash incisions daily with soap and water.     He may not shower.  J-P drains are to be on wall suction in the orange     with bulbs collapsed while in his room.  The J-P drains can be off     suction while ambulating.  4. The patient is to continue incentive spirometry q.2h. while awake as well     as flutter valve q.2h. while awake.  5. Continue daily weights and structured measurement of I's and O's with J-P     drains measured separately with output every 8 hours. 6. Continue lab work of daily PT,  INR, and other labs as needed.  7. Continue four times a day Accu-Cheks a.c. and h.s. with sliding scale     insulin as follows.  If CBGs less than 160, then 0 units; 161 to 200,     then 4 units; 201 to 250, 8 units; 251 to 300, 12 units; 301 to 350, 16     units; 351 to 450, 20 units.  If greater than 451, then 24 units and call     M.D.   DISPOSITION:  The patient is being transferred to subacute care unit in  improved and stable condition.  The CVTS service will continue to follow his  care as well as Dr. Odis Luster and plastic surgery team.      Carmin Muskrat. Francis, P.A.  Salvatore Decent Dorris Fetch, M.D.    CAF/MEDQ  D:  09/29/2003  T:  09/29/2003  Job:  [id number]   cc:   Etter Sjogren, M.D.  [address]  [address]  [place]  [place] [address]  Fax: [phone]

## 2010-12-08 NOTE — Op Note (Signed)
NAME:  Gary [name], Gary [name]                         ACCOUNT NO.:  [id number]   MEDICAL RECORD NO.:  [id number]                   PATIENT TYPE:  INP   LOCATION:  4730                                 FACILITY:  [facility]   PHYSICIAN:  Evelene Croon, M.D.                  DATE OF BIRTH:  Mar 15, 1941   DATE OF PROCEDURE:  08/07/2003  DATE OF DISCHARGE:                                 OPERATIVE REPORT   PREOPERATIVE DIAGNOSIS:  Sternal wound infection with sternal dehiscence.   POSTOPERATIVE DIAGNOSIS:  Sternal wound infection with sternal dehiscence.   PROCEDURE:  Sternal wound debridement.   SURGEON:  Evelene Croon, M.D.   ANESTHESIA:  General endotracheal anesthesia.   INDICATIONS FOR PROCEDURE:  The patient is a 70 year old gentleman with a  history of diabetes and chronic obstructive pulmonary disease as well as  obesity who underwent  cardiac  bypass by Dr. Dorris Fetch in December 2004.  He now presents with sternal drainage and instability on examination  and  chest x-ray showing the sternal wires were intact but appeared  to have  pulled through the sternum.   He underwent  a chest CT scan which showed gross separation of the sternum.  There was a large fluid collection within the subcutaneous tissue, extending  down below the sternum. It was felt that the patient should be taken to the  operating room for sternal debridement as soon as possible, since he did  have significant erythema along the sternal wound, and gross instability  with the COPD.   I discussed the operative procedure with the patient and  his wife,  including  alternatives, benefits, and risks, including  bleeding, blood  transfusion, infection, stroke, myocardial infarction and death. I also  discussed the need for further surgery, including  possible  muscle flap  closure of the wound. They understood and agreed to proceed.   DESCRIPTION OF PROCEDURE:  The patient was taken to the operating room and  placed on the  table in the supine position. After induction of general  endotracheal anesthesia, a Foley catheter was placed in the bladder using  sterile technique. Then the chest and abdomen  were prepped with Betadine  soap and solution and draped in the usual sterile manner.   The sternal wound was opened and a large amount of serosanguinous fluid was  removed. This did not appear grossly purulent. Aerobic and anaerobic  cultures were taken. The wound was completely  opened and  the sternum had  completely separated  along its entire length. All the sternal wires had  pulled through the left side of the sternum and the left side of the sternum  appeared essentially destroyed in multiple pieces. The right side of the  sternum appeared intact. The heart was visible in the depth of the wound.   All the fluid  was drained, and the wound was  debrided of fibrinous  material. The wound was then irrigated with saline solution. A wound VAC  sponge was inserted and the vacuum set at 75. This appeared  to provide nice  wound coverage. All sponge, instrument and needle counts were correct  according to the scrub nurse.   The patient was awakened. He was extubated and transported to the post  anesthesia care unit in satisfactory, stable condition.                                               Evelene Croon, M.D.    BB/MEDQ  D:  08/07/2003  T:  08/07/2003  Job:  [id number]

## 2010-12-08 NOTE — Discharge Summary (Signed)
NAME:  Gary [name], Gary [name]                         ACCOUNT NO.:  [id number]   MEDICAL RECORD NO.:  [id number]                   PATIENT TYPE:  INP   LOCATION:  2023                                 FACILITY:  [facility]   PHYSICIAN:  Salvatore Decent. Dorris Fetch, M.D.         DATE OF BIRTH:  1941/05/23   DATE OF ADMISSION:  07/21/2003  DATE OF DISCHARGE:  07/28/2003                                 DISCHARGE SUMMARY   ADMISSION DIAGNOSIS:  Non-Q-wave myocardial infarction.   PAST MEDICAL HISTORY:  1. Coronary artery disease, status post cardiac catheterization at [facility], [place], [place].  Coronary bypass recommended at that time.     He declined.  He has been treated medically since that time.  2. Hypertension.  3. Hyperlipidemia.  4. Continue tobacco use, 1-1/2 packs per day for 50+ years.   ALLERGIES:  No known drug allergies.   DISCHARGE DIAGNOSES:  1. Severe three vessel coronary artery disease with unstable angina, status     post non-Q-wave myocardial infarction.  Status post coronary artery     bypass grafting x5.  2. New onset diabetes mellitus type 2, admitting hemoglobin A1C 7.0.  3. Postoperative atrial fibrillation, treated with amiodarone, digoxin and     Lopressor.   HISTORY OF PRESENT ILLNESS:  Mr. Gary [name] is a 70 year old Caucasian man.  He  presented to the [facility] Emergency Department on  July 18, 2003, with history of intermittent chest pain for greater than  12 hours.  Sublingual nitroglycerin taken at home gave him some relief.  EKG  on admission there revealed lateral ST wave abnormalities consistent with  ischemia.  He was ruled in for MI.  Cardiology consultation was requested  and he was seen by Dr. Darrold Junker.  He recommended cardiac catheterization.  This was performed on July 20, 2003, and revealed significant three  vessel coronary artery disease.  Also inferior akinesis with ejection  fraction estimated to be 40%.   Cardiac surgery consultation was requested  and arrangements were made for Mr. Gary [name] to be transferred to [facility] in the care of Dr. Charlett Lango.   HOSPITAL COURSE:  On July 21, 2003, Mr. Gary [name] was transferred from  [facility] to [place]. [facility] in the  care of Dr. Charlett Lango.  He arrived stable.  No chest pain on  heparin drip.  After reviewing Mr. Gary [name] catheterization films, Dr.  Dorris Fetch agreed that coronary artery bypass grafting was the preferred  treatment for this gentleman.  The procedure risks and benefits were all  discussed with Mr. Gary [name].  He agreed to proceed with surgery.   July 22, 2003, [name] underwent the following surgical procedure  with Dr. Charlett Lango.  Coronary artery bypass grafting x5.  Grafts  placed at the time of the procedure were left internal mammary artery  was  grafted to the  left anterior descending artery, saphenous vein was grafted  to the diagonal artery, saphenous vein was grafted in sequential fashion to  the first and second obtuse marginal arteries, saphenous vein was grafted to  the acute marginal artery.  Vein was harvested from the right thigh via  endovein harvesting technique through the mid calf with an open surgical  technique.  Also harvested from the left thigh via endovein harvesting  technique.  The left ankle vein was explored but was not suitable for bypass  graft.  He tolerated this procedure well, transferred in stable condition to  the SICU.  He remained hemodynamically stable in the immediate postoperative  period, was extubated several hours after arrival in the intensive care  unit.  He awoke from anesthesia neurologically intact.  He had some  hyperglycemia in the postoperative period.  Hemoglobin A1C, as stated above,  was 7.0.  He was treated with Lantus insulin for several days after surgery.   On postoperative day #2,  Mr. Gary [name] developed atrial fibrillation.  He was  treated with amiodarone.  He conversed to normal sinus rhythm by the morning  of postoperative day #3.  He has remained in sinus rhythm since that time.  Mr. Gary [name] is making good progress recovering from surgery.  He was  transferred out of the intensive care unit on postoperative day #3.   Mr. Gary [name] was seen by the smoking cessation counselor in the preoperative  period.  He states that he is ready to stop smoking.  He plans to quit cold  [place].  He will be followed up in the postoperative period via telephone  calls.   Mr. Gary [name] was also seen by diabetes education.  Basic diabetes education  was initiated, however, Mr. Gary [name] will require outpatient diabetes  education.  His Lantus insulin was changed to oral Glipizide on  postoperative day #4.  His CBGs had been ranging 108 to 175 in the last 48  hours.   On July 27, 2003, postoperative day #4, Mr. Gary [name] continues to make good  progress recovering from his surgery.  His vital signs are stable with blood  pressure 125/78, he is afebrile.  His room air saturation is 92%.  His  weight is 212.7 pounds.  This is still approximately 14 pounds over his  preoperative weight.  His heart is maintaining normal sinus rhythm at 84  beats per minute.  His lungs are with some upper respiratory secretions.  He  clears them well.  His lung fields are clear to auscultation.  He is  tolerating his diet without nausea.  His bowel and bladder functions are  within normal limits for him.  His incisions are all healing well.  He does  have mild bilateral lower extremity edema and bilateral thigh ecchymosis.  He is ambulating independently.  His pain is well controlled.  If Mr. Gary [name]  continues to progress in this manner, it is expected he will be ready for  discharge home tomorrow, July 28, 2003.   CONDITION ON DISCHARGE:  Improved.   DISCHARGE INSTRUCTIONS:  1. Medications:    a. Enteric  coated aspirin 325 mg p.o. daily.     b. Lopressor 25 mg b.i.d.     c. Digoxin 0.125 mg daily.     d. Altace 2.5 mg daily.     e. Amiodarone 200 mg b.i.d.     f. Lasix 40 mg daily for one week.     g. Potassium chloride 20 mEq p.o. daily for  one week.     h. Zocor 20 mg each evening.        a. Glucotrol XL 5 mg daily.  2. For pain management, he may have Tylox one to two p.o. q.4-6h. p.r.n.  3. Activities:  He has been asked to refrain from any driving, heavy     lifting, pushing or pulling.  He was also instructed to continue his     breathing exercises and daily walking.  4. Diet is to be a heart healthy diabetic diet.  5. Wound care:  He is to shower daily.  If his incisions show any sign of     infection, or if he has a fever greater than 101 degrees F, he is to call     Dr. Sunday Corn office.  6. Follow-up:     a. He has been asked to follow up with his primary care physician, Dr.        Karleen Hampshire, at [facility] in approximately one week        regarding his diabetes.     b. Dr. Gwen Pounds should see him in approximately two weeks and Mr. Ekstein        has been asked to call to        arrange that appointment.     c. Dr. Dorris Fetch would like to see him at the CVTS office on Tuesday,        August 31, 2003, at 11:30 a.m.  He is asked to have a chest x-ray at        [facility] at 10:30 that morning.      Toribio Harbour, N.P.                  Salvatore Decent Dorris Fetch, M.D.    CTK/MEDQ  D:  07/27/2003  T:  07/28/2003  Job:  [id number]   cc:   Arnoldo Hooker, M.D.  [address]  [place], [place]   Dr. Karleen Hampshire  [facility]  Dept. of [facility]

## 2010-12-08 NOTE — Discharge Summary (Signed)
NAME:  Gary [name], Gary [name]                         ACCOUNT NO.:  [id number]   MEDICAL RECORD NO.:  [id number]                   PATIENT TYPE:  INP   LOCATION:  2041                                 FACILITY:  [facility]   PHYSICIAN:  Salvatore Decent. Dorris Fetch, M.D.         DATE OF BIRTH:  [birth year]   DATE OF ADMISSION:  09/09/2003  DATE OF DISCHARGE:  10/12/2003                                 DISCHARGE SUMMARY   ADDENDUM:  This is an addendum that was completed for an anticipated date of  discharge of September 30, 2003.  The patient is now being discharged.  The date  of admission remains the same at September 09, 2003.  The anticipated date of  discharge is October 12, 2003.   ADDITIONAL DIAGNOSES AND DISCHARGE DIAGNOSES:  1. Chronic obstructive pulmonary disease with acute exacerbation during this     hospitalization requiring extended hospital stay.  2. Asymptomatic nonsustained ventricular tachycardia being followed by Dr.     Ladona Ridgel of electrophysiology.  3. Postoperative atrial fibrillation/atrial flutter treated with chronic     Coumadin therapy.   The remaining discharge diagnoses remain the same as on the original  Discharge Summary.   HOSPITAL COURSE:  Mr. Gary [name] was anticipated to be discharged to the  subacute care unit on September 30, 2003.  Unfortunately on morning rounds that  day, he was found to have a marked increase in wheezing with increase in  cough productive of sputum.  The patient was noticeably uncomfortable with  increased work of breathing.  He was saturating at 92 to 95% on room air.  He had diffuse inspiratory and expiratory wheezes and coarse rhonchi  throughout.  A sputum sample was sent for culture which was negative.  A  chest x-ray was completed that day which showed little change in  cardiomegaly with mild congestion and small bilateral effusions.  There  remained opacity at the left lung base medially which was consistent with  atelectasis or possibly pneumonia.   Humibid was added to the patient's  pulmonary regimen, and Xopenex and Atrovent nebulizers were continued.  The  patient was started empirically on Avelox prior to the return of his sputum  cultures which were negative.  The patient's diuretic was also increased to  Lasix 80 mg twice daily.   Over the next several days, Mr. Gary [name] fully recovered from acute  exacerbation of COPD.  He was saturating at an appropriate range on room  air.  He was ambulating in the hallways with cardiac rehab without  difficulty.  Chest x-ray showed interval improvement in interstitial edema  over the next couple of days.   The patient continued to be followed by Dr. Odis Luster, plastic surgery, for  management of his Jackson-Pratt drains as well as his sternal wound.  The  Jackson-Pratt drain slowly decreased in output over time and discontinued in  stepwise fashion.  Two Jackson-Pratt drains remain at the time of this  dictation with very little output.  The patient continues to remain afebrile  from an infectious disease standpoint, and his white count was  within  normal limits.  He continued to receive IV vancomycin 1 g ever 24 hours.   Over the remaining hospital days, Mr. Gary [name] continued to make slow and  steady progress.  He occasionally required supplemental oxygen with  ambulation, otherwise saturating above 90% on room air at the time of  initial discharge planning.  He continued to remain in rate-controlled  atrial fibrillation and intermittent atrial flutter.   He did have a run of nonsustained ventricular tachycardia which was  asymptomatic overnight of October 05, 2003.  Echocardiogram was completed on  the following day for further evaluation.  This showed an estimated left  ventricular ejection fraction of 25 to 35%.  There was mild aortic valve  regurgitation and mild mitral valve regurgitation.  The left atrium was  dilated.  An electrophysiology consult was initiated, and Dr. Ladona Ridgel   responded appropriately.  Impression was that the patient had ischemic  cardiomyopathy with ejection fraction estimated at 40%.  Recommendations  were to maintain potassium greater than or equal to 4.0, continue amiodarone  and beta blockers for atrial fibrillation.  He also recommended to continue  Coumadin for atrial fibrillation.  Amiodarone was decreased transiently for  better rate control of atrial fibrillation.  Dr. Ladona Ridgel felt that [name]  may require an ICD sometime in the future; however, with the patient's  recent staph wound infection, he felt it would be appropriate to hold off  for now with that decision.  He planned to follow up with [name] as an  outpatient once he was recovered from his recent surgery.   We continued to follow Mr. Gary [name]' PT and INR for appropriate  anticoagulation for chronic atrial fibrillation.  His daily Coumadin was  adjusted appropriately.  He continued on his pulmonary regimen of nebulizer  treatments as well as pulmonary toilet and antibiotics.  He continued to  make good progress and was stable and deemed appropriate for initiation of  discharge planning October 11, 2003.   The patient met with case management and was in agreement for [facility] to provide home health nursing for wound checks, vital sign  measurements, and Jackson-Pratt drains.  He also would receive home IV  antibiotics through the PICC line placed September 14, 2003.   At the time of initiation of discharge planning, the patient's physical exam  was as follows.  He was afebrile with blood pressure 90s to 130s/60s to 70s.  Heart rate 100 to 110 in atrial flutter.  SAO2 90% to 96% on room air.  Weight 197 pounds, down from212 pounds on prior Discharge Summary dictation.  His bedside glucose measurements in the last 24 hours were as follows, 151,  117, 259, and 181.  Heart regular rate, reading atrial flutter on telemetry. He had bibasilar crackles and inspiratory  wheezes which were improved from  prior exam.  He had 1 to 2+ pitting edema to the mid shin, which was also  improving from prior exams.  His incisions were all healing well without  evidence of infection.  Jackson-Pratt drains were intact with serous  drainage.   At time of discharge planning from October 11, 2003, read pro time 18.7, INR  1.9.  BMP: Sodium 134, potassium 4.6, chloride 99, CO2 30, glucose 121, BUN  22, creatinine 1.6, and calcium 7.9.   We will continue  as planned with discharge to home on October 12, 2003,  pending a.m. rounds and no changes in patient's clinical status.   DISCHARGE MEDICATIONS:  1. Flomax 0.4 mg daily.  2. Diltiazem 120 mg daily.  3. Zocor 20 mg daily.  4. Digoxin 0.25 mg daily.  5. Warfarin 2.0 mg daily.  6. Lasix 80 mg daily.  7. Multivitamins daily.  8. Pulmicort inhaler 1 to 2 puffs twice daily.  9. Combivent MDI 90/18 mcg 2 puffs 4 times daily.  10.      Toprol XL 50 mg daily.  11.      Glucotrol XL 10 mg daily.  12.      Aldactone 50 mg twice daily.  13.      K-Dur 20 mEq daily.  14.      Vancomycin 1 g IV q.48h. until April 8,2005, to be administered by     home health registered nurse.  15.      Tylox 1 to 2 tablets every 4 to 6 hours as needed for pain.   DISCHARGE INSTRUCTIONS:  1. Activity:  No lifting greater than10 pounds.  No driving.  Continue to     walk daily.  Continue breathing exercises.  2. Diet:  Count carbohydrates.  Low-cholesterol, heart-healthy diet.  3. Wound care:  Gently wash incisions daily with soap and water.  A home     health registered nurse is to visit daily to check wounds and administer     IV antibiotics.  Please notify [facility] office if you have any increased     redness, drainage, or swelling from your incisions.   FOLLOW UP:  1. The patient is to call Dr. Philemon Kingdom office at [phone] to set up an     appointment to get PT/INR checked on Thursday, October 14, 2003.  The     Coumadin flow sheet from  his inpatient stay will be faxed to Dr.     Philemon Kingdom office at the time of discharge.  2. The patient is to see Dr. Odis Luster, plastic surgery, within two weeks of     discharge.  He is to call the office at [phone] to set up this     appointment.  3. The patient is to see Dr. Ladona Ridgel, electrophysiology, within six to eight     weeks of discharge.  He is to call the office at [phone] to set up     this appointment.  4. The patient is to see Dr. Dorris Fetch within three weeks of discharge.     The CVTS office will call him with the appointment date and time.  The     patient will also get a chest x-ray completed at [facility] on that same day and will hand carry his films to Dr.     Sunday Corn office.      Carolyn A. Eustaquio Boyden.                  Salvatore Decent Dorris Fetch, M.D.    CAF/MEDQ  D:  10/11/2003  T:  10/13/2003  Job:  [id number]   cc:   Salvatore Decent. Dorris Fetch, M.D.  [address]  [place] [address]   Etter Sjogren, M.D.  [address]  [address]  [place]  [place] [address]  Fax: [phone]   Dr. Dois Davenport (Add first Discharge Summary)   Doylene Canning. Ladona Ridgel, M.D.  Add first Discharge Summary

## 2010-12-08 NOTE — Consult Note (Signed)
NAME:  Gary [name], Gary [name]                         ACCOUNT NO.:  [id number]   MEDICAL RECORD NO.:  [id number]                   PATIENT TYPE:  INP   LOCATION:  2013                                 FACILITY:  [facility]   PHYSICIAN:  Etter Sjogren, M.D.                  DATE OF BIRTH:  04-Mar-1941   DATE OF CONSULTATION:  09/09/2003  DATE OF DISCHARGE:                                   CONSULTATION   CHIEF COMPLAINT:  Abscess status post sternal wound reconstruction.   HISTORY OF PRESENT ILLNESS:  A 70 year old man with a history of coronary  artery disease, diabetes, COPD, and hypertension who has had a sternal wound  infection August 06, 2003. He underwent reconstruction of pectoralis muscle  flaps. He had done well initially but has developed purulent drainage last  night at home. He also had fever apparently as well. The wound itself it is  not draining but the drains that are in place are.   PAST MEDICAL HISTORY:  1. Positive coronary artery disease.  2. Positive hypertension.  3. COPD.  4. Diabetes.   For complete details and further history, please see the old chart.   PHYSICAL EXAMINATION:  On physical examination of mid chest, he does have  midline sternal wound. It appears to be well healed. There is no evidence of  fluctuance. There is no cellulitis or exudate. Drains are still in place  laterally, and they are draining some purulent material which has been  cultured.   I reviewed the CT, and it reveals fluid beneath the pectoralis flaps.   The plan is to begin IV antibiotics and take him to surgery tomorrow for  exploration and incision and drainage and hopefully reclosure of her drains  as well.                                               Etter Sjogren, M.D.    DB/MEDQ  D:  09/09/2003  T:  09/10/2003  Job:  [id number]

## 2010-12-08 NOTE — Op Note (Signed)
NAME:  Gary [name], Gary [name]                         ACCOUNT NO.:  [id number]   MEDICAL RECORD NO.:  [id number]                   PATIENT TYPE:  INP   LOCATION:  2301                                 FACILITY:  [facility]   PHYSICIAN:  Kerin Perna III, M.D.           DATE OF BIRTH:  July 25, 1940   DATE OF PROCEDURE:  08/13/2003  DATE OF DISCHARGE:                                 OPERATIVE REPORT   OPERATION:  Sternal debridement and placement of right chest tube.   SURGEON:  Kerin Perna, M.D.   ASSISTANT:  Etter Sjogren, M.D.   ANESTHESIA:  General.   PREOPERATIVE DIAGNOSIS:  Sternal wound dehiscence with preparation for  pectoralis muscle flap closure and reconstruction of the wound.   POSTOPERATIVE DIAGNOSIS:  Sternal wound dehiscence with preparation for  pectoralis muscle flap closure and reconstruction of the wound.   PROCEDURE:  The patient was brought to the operating room for sternal  debridement and sternal wound reconstruction with bilateral pectoralis  muscle flap closure.  In preparation for the muscle flap reconstruction, I  had to actually debride the mediastinum and the patient also required a  right chest tube for a right pneumothorax.  The patient was prepped and  draped as a sterile field.  Using the rongeurs, the sternal table was  debrided on both sides to healthy bleeding bone.  The mediastinal soft  tissue was debrided where there were scattered areas of fat necrosis from  the pleural and thymic fat.  Overall, the wound had over 90% granulation  tissue and was clean and in good condition for coverage with muscle flap.  The right pleural space was in communication with the mediastinum and, for  that reason, a chest tube was placed in the right pleural space with a small  incision on the anterolateral aspect of the right chest in the fifth  interspace.  The chest tube was secured to the skin with a silk suture.  The  remainder of the muscle flap reconstruction will  be dictated by Dr. Etter Sjogren.                                               [name], M.D.    PV/MEDQ  D:  08/13/2003  T:  08/13/2003  Job:  [id number]

## 2011-02-11 ENCOUNTER — Inpatient Hospital Stay: Payer: Medicare Other | Admitting: Internal Medicine

## 2011-03-29 ENCOUNTER — Encounter: Payer: Self-pay | Admitting: *Deleted

## 2011-04-30 ENCOUNTER — Inpatient Hospital Stay: Payer: Medicare Other | Admitting: Internal Medicine

## 2011-05-02 ENCOUNTER — Encounter: Payer: Self-pay | Admitting: Internal Medicine

## 2011-05-03 ENCOUNTER — Encounter: Payer: No Typology Code available for payment source | Admitting: Internal Medicine

## 2011-05-21 ENCOUNTER — Inpatient Hospital Stay: Payer: Medicare Other | Admitting: Internal Medicine

## 2011-05-22 ENCOUNTER — Encounter: Payer: Self-pay | Admitting: Internal Medicine

## 2011-05-24 ENCOUNTER — Ambulatory Visit: Payer: Medicare Other | Admitting: Internal Medicine

## 2011-05-25 ENCOUNTER — Encounter: Payer: No Typology Code available for payment source | Admitting: Internal Medicine

## 2011-05-31 ENCOUNTER — Inpatient Hospital Stay: Payer: Medicare Other | Admitting: Internal Medicine

## 2011-06-04 ENCOUNTER — Encounter: Payer: Self-pay | Admitting: Internal Medicine

## 2011-06-19 ENCOUNTER — Inpatient Hospital Stay: Payer: Medicare Other | Admitting: Family Medicine

## 2011-06-23 ENCOUNTER — Ambulatory Visit: Payer: Medicare Other | Admitting: Internal Medicine

## 2011-06-30 ENCOUNTER — Inpatient Hospital Stay: Payer: Medicare Other | Admitting: Internal Medicine

## 2011-08-24 ENCOUNTER — Encounter: Payer: Self-pay | Admitting: *Deleted

## 2011-09-13 ENCOUNTER — Encounter: Payer: Self-pay | Admitting: Internal Medicine

## 2011-09-13 ENCOUNTER — Ambulatory Visit (INDEPENDENT_AMBULATORY_CARE_PROVIDER_SITE_OTHER): Payer: No Typology Code available for payment source | Admitting: *Deleted

## 2011-09-13 DIAGNOSIS — I440 Atrioventricular block, first degree: Secondary | ICD-10-CM

## 2011-09-13 DIAGNOSIS — I5022 Chronic systolic (congestive) heart failure: Secondary | ICD-10-CM

## 2011-09-13 DIAGNOSIS — I495 Sick sinus syndrome: Secondary | ICD-10-CM

## 2011-09-13 LAB — ICD DEVICE OBSERVATION
AL IMPEDENCE ICD: 532 Ohm
ATRIAL PACING ICD: 15.96 pct
CHARGE TIME: 10.179 s
DEV-0020ICD: NEGATIVE
PACEART VT: 0
TOT-0001: 9
TOT-0002: 0
TOT-0006: 20081112000000
TZAT-0001FASTVT: 1
TZAT-0001SLOWVT: 1
TZAT-0001SLOWVT: 2
TZAT-0004SLOWVT: 8
TZAT-0012SLOWVT: 200 ms
TZAT-0012SLOWVT: 200 ms
TZAT-0013FASTVT: 1
TZAT-0013SLOWVT: 2
TZAT-0013SLOWVT: 2
TZAT-0018FASTVT: NEGATIVE
TZAT-0018SLOWVT: NEGATIVE
TZAT-0018SLOWVT: NEGATIVE
TZAT-0019FASTVT: 8 V
TZAT-0019SLOWVT: 8 V
TZAT-0019SLOWVT: 8 V
TZAT-0020SLOWVT: 1.5 ms
TZAT-0020SLOWVT: 1.5 ms
TZON-0003FASTVT: 300 ms
TZON-0003SLOWVT: 330 ms
TZON-0003VSLOWVT: 450 ms
TZON-0004SLOWVT: 32
TZST-0001FASTVT: 2
TZST-0001FASTVT: 3
TZST-0001FASTVT: 5
TZST-0001FASTVT: 6
TZST-0001SLOWVT: 3
TZST-0001SLOWVT: 5
TZST-0003FASTVT: 35 J
TZST-0003FASTVT: 35 J
TZST-0003SLOWVT: 35 J
TZST-0003SLOWVT: 35 J
VENTRICULAR PACING ICD: 0 pct

## 2011-09-13 NOTE — Progress Notes (Signed)
ICD check 

## 2011-09-23 ENCOUNTER — Emergency Department: Payer: Self-pay | Admitting: Emergency Medicine

## 2011-09-23 LAB — COMPREHENSIVE METABOLIC PANEL
Alkaline Phosphatase: 77 U/L (ref 50–136)
BUN: 24 mg/dL — ABNORMAL HIGH (ref 7–18)
Bilirubin,Total: 0.3 mg/dL (ref 0.2–1.0)
Calcium, Total: 8.5 mg/dL (ref 8.5–10.1)
Chloride: 96 mmol/L — ABNORMAL LOW (ref 98–107)
Co2: 38 mmol/L — ABNORMAL HIGH (ref 21–32)
EGFR (African American): 54 — ABNORMAL LOW
EGFR (Non-African Amer.): 45 — ABNORMAL LOW
Glucose: 206 mg/dL — ABNORMAL HIGH (ref 65–99)
Osmolality: 295 (ref 275–301)
Sodium: 143 mmol/L (ref 136–145)

## 2011-09-23 LAB — CBC
HCT: 42.2 % (ref 40.0–52.0)
HGB: 13.4 g/dL (ref 13.0–18.0)
MCHC: 31.9 g/dL — ABNORMAL LOW (ref 32.0–36.0)
Platelet: 156 10*3/uL (ref 150–440)
RDW: 15.7 % — ABNORMAL HIGH (ref 11.5–14.5)
WBC: 8.4 10*3/uL (ref 3.8–10.6)

## 2011-09-23 LAB — URINALYSIS, COMPLETE
Bacteria: NONE SEEN
Bilirubin,UR: NEGATIVE
Blood: NEGATIVE
Glucose,UR: 50 mg/dL (ref 0–75)
Ketone: NEGATIVE
Nitrite: NEGATIVE
Specific Gravity: 1.009 (ref 1.003–1.030)
Squamous Epithelial: NONE SEEN
WBC UR: 1 /HPF (ref 0–5)

## 2011-09-23 LAB — PROTIME-INR: Prothrombin Time: 20.1 secs — ABNORMAL HIGH (ref 11.5–14.7)

## 2011-12-11 ENCOUNTER — Encounter: Payer: Self-pay | Admitting: Internal Medicine

## 2011-12-11 ENCOUNTER — Ambulatory Visit (INDEPENDENT_AMBULATORY_CARE_PROVIDER_SITE_OTHER): Payer: No Typology Code available for payment source | Admitting: Internal Medicine

## 2011-12-11 VITALS — BP 128/80 | HR 65 | Ht 67.0 in | Wt 233.0 lb

## 2011-12-11 DIAGNOSIS — I440 Atrioventricular block, first degree: Secondary | ICD-10-CM

## 2011-12-11 DIAGNOSIS — R296 Repeated falls: Secondary | ICD-10-CM | POA: Insufficient documentation

## 2011-12-11 DIAGNOSIS — I5022 Chronic systolic (congestive) heart failure: Secondary | ICD-10-CM

## 2011-12-11 DIAGNOSIS — W19XXXA Unspecified fall, initial encounter: Secondary | ICD-10-CM

## 2011-12-11 DIAGNOSIS — Z95810 Presence of automatic (implantable) cardiac defibrillator: Secondary | ICD-10-CM

## 2011-12-11 DIAGNOSIS — I2589 Other forms of chronic ischemic heart disease: Secondary | ICD-10-CM

## 2011-12-11 DIAGNOSIS — I4891 Unspecified atrial fibrillation: Secondary | ICD-10-CM

## 2011-12-11 DIAGNOSIS — I4892 Unspecified atrial flutter: Secondary | ICD-10-CM

## 2011-12-11 LAB — ICD DEVICE OBSERVATION
AL AMPLITUDE: 1.125 mv
AL THRESHOLD: 0.75 V
BAMS-0001: 170 {beats}/min
BATTERY VOLTAGE: 2.9599 V
FVT: 0
RV LEAD AMPLITUDE: 9.5 mv
RV LEAD IMPEDENCE ICD: 475 Ohm
RV LEAD THRESHOLD: 0.75 V
TOT-0001: 9
TZAT-0004FASTVT: 8
TZAT-0004SLOWVT: 8
TZAT-0005FASTVT: 88 pct
TZAT-0005SLOWVT: 88 pct
TZAT-0005SLOWVT: 91 pct
TZAT-0011SLOWVT: 10 ms
TZAT-0011SLOWVT: 10 ms
TZAT-0012FASTVT: 200 ms
TZAT-0019SLOWVT: 8 V
TZAT-0020FASTVT: 1.5 ms
TZON-0004VSLOWVT: 20
TZST-0001FASTVT: 4
TZST-0001SLOWVT: 4
TZST-0003FASTVT: 25 J
TZST-0003FASTVT: 35 J
TZST-0003FASTVT: 35 J
TZST-0003SLOWVT: 20 J
TZST-0003SLOWVT: 35 J
VF: 0

## 2011-12-11 NOTE — Assessment & Plan Note (Signed)
The patient's device was interrogated.  The information was reviewed. No changes were made in the programming.   His QRS and now 150 ms albeit with an IVCD pattern. CRT upgrade may be worth considering

## 2011-12-11 NOTE — Assessment & Plan Note (Signed)
Recurrent atrial fibrillation but none detected by his device in the last 6 or 7 months. This has been on the amiodarone. We will decrease the dose to 400-200 mg a day. Surveillance laboratories are to be obtained from his PCP later this week.  I am worried about photosensitivity and I advised him to wear a shirt. I'm also concerned that his gait disturbances a manifestation of neurotoxicity. This is frequently dose related and hopefully decreasing the dose will help to mitigate some of this. In the event that he continues to be a problem, we will need to consider neurological consultation and discontinuation of the amiodarone. At that point  AV junction ablation-plus/minus CRT might be worth considering

## 2011-12-11 NOTE — Assessment & Plan Note (Signed)
As above.

## 2011-12-11 NOTE — Patient Instructions (Signed)
Your physician has recommended you make the following change in your medication: decrease amiodarone to 200 mg daily.

## 2011-12-11 NOTE — Progress Notes (Signed)
HPI  Gary [name] is a 71 y.o. male seen in followup for an ICD implanted for primary prevention in the setting of ischemic heart disease and depressed left ventricular function. He has chronic systolic heart failure   He has history of atrial flutter and is status post ablation.   Has had recurrent atrial fibrillation associated with inappropriate ICD discharges. He was started on amiodarone last year. He remains on 400 mg a day. He has photosensitivity and gait disturbance. His respiratory status is stable and there is no significant nausea.  He denies shortness of breath or chest pain. He has chronic edema   Past Medical History  Diagnosis Date  . Automatic implantable cardiac defibrillator in situ   . Sinoatrial node dysfunction   . First degree atrioventricular block   . Other specified cardiac dysrhythmias   . Other specified forms of chronic ischemic heart disease   . Chronic systolic heart failure   . Atrial flutter   . Overweight     Past Surgical History  Procedure Date  . Coronary artery bypass graft 2004  . Muscle flap surgery 2005    Current Outpatient Prescriptions  Medication Sig Dispense Refill  . albuterol (PROVENTIL, VENTOLIN) (5 MG/ML) 0.5% NEBU Take 5 mg/hr by nebulization continuous as needed.        . ALPRAZolam (XANAX) 0.25 MG tablet Take 0.25 mg by mouth at bedtime as needed.      Marland Kitchen amiodarone (PACERONE) 200 MG tablet Take 200 mg by mouth 2 (two) times daily.      . captopril (CAPOTEN) 25 MG tablet Take 25 mg by mouth 2 (two) times daily.        . carvedilol (COREG) 25 MG tablet Take 12.5 mg by mouth 2 (two) times daily with a meal.       . diltiazem (DILACOR XR) 180 MG 24 hr capsule Take 180 mg by mouth daily.      . ergocalciferol (VITAMIN D2) 50000 UNITS capsule Take 50,000 Units by mouth as directed.      . furosemide (LASIX) 20 MG tablet Take 80 mg by mouth 2 (two) times daily.       . insulin NPH-insulin regular (NOVOLIN 70/30) (70-30) 100  UNIT/ML injection Inject into the skin as directed.      . meclizine (ANTIVERT) 25 MG tablet Take 25 mg by mouth as directed.      . Multiple Vitamin (MULTIVITAMIN) tablet Take 1 tablet by mouth daily.        . pantoprazole (PROTONIX) 40 MG tablet Take 40 mg by mouth 2 (two) times daily.       . potassium chloride (KLOR-CON) 10 MEQ CR tablet Take 10 mEq by mouth daily.        . predniSONE (DELTASONE) 5 MG tablet Take 5 mg by mouth daily.      . sertraline (ZOLOFT) 100 MG tablet Take 100 mg by mouth daily.      . simvastatin (ZOCOR) 40 MG tablet Take 40 mg by mouth at bedtime.        . theophylline (THEO-24) 100 MG 24 hr capsule Take 100 mg by mouth 2 (two) times daily.      Marland Kitchen warfarin (COUMADIN) 1 MG tablet Take 1 mg by mouth as directed.          Allergies  Allergen Reactions  . Atorvastatin     REACTION: muscle aches  . Spironolactone     REACTION: burning sensation    Review  of Systems negative except from HPI and PMH  Physical Exam BP 128/80  Pulse 65  Ht 5\' 7"  (1.702 m)  Wt 233 lb (105.688 kg)  BMI 36.49 kg/m2 Well developed and well nourished in no acute distress sitting in a wheelchair using oxygen HENT normal E scleral and icterus clear Neck Supple  2 Clear to ausculation Decreased bilateral Regular rate and rhythm, no murmurs gallops or rub Soft with active bowel sounds No clubbing cyanosis 1+ Edema Alert and oriented, grossly normal motor and sensory function Skin Warm and Dry  Atrially paced at 70 Intervals 0.31/15/46 IVCD pattern  Assessment and  Plan

## 2011-12-11 NOTE — Assessment & Plan Note (Signed)
Continue current meds 

## 2011-12-11 NOTE — Assessment & Plan Note (Signed)
Stable

## 2012-01-18 ENCOUNTER — Ambulatory Visit: Payer: Self-pay | Admitting: Internal Medicine

## 2012-01-21 ENCOUNTER — Ambulatory Visit: Payer: Self-pay | Admitting: Internal Medicine

## 2012-02-01 ENCOUNTER — Emergency Department: Payer: Self-pay | Admitting: Emergency Medicine

## 2012-02-05 ENCOUNTER — Emergency Department: Payer: Self-pay | Admitting: Emergency Medicine

## 2012-02-05 LAB — COMPREHENSIVE METABOLIC PANEL
Alkaline Phosphatase: 141 U/L — ABNORMAL HIGH (ref 50–136)
Calcium, Total: 8.8 mg/dL (ref 8.5–10.1)
Chloride: 99 mmol/L (ref 98–107)
Co2: 36 mmol/L — ABNORMAL HIGH (ref 21–32)
EGFR (Non-African Amer.): 43 — ABNORMAL LOW
Osmolality: 292 (ref 275–301)
Sodium: 143 mmol/L (ref 136–145)

## 2012-02-05 LAB — URINALYSIS, COMPLETE
Bilirubin,UR: NEGATIVE
Ketone: NEGATIVE
Leukocyte Esterase: NEGATIVE
Ph: 5 (ref 4.5–8.0)
Protein: NEGATIVE
RBC,UR: <1 /HPF (ref 0–5)
WBC UR: NONE SEEN /HPF (ref 0–5)

## 2012-02-05 LAB — LIPASE, BLOOD: Lipase: 70 U/L — ABNORMAL LOW (ref 73–393)

## 2012-02-06 LAB — CBC WITH DIFFERENTIAL/PLATELET
Eosinophil #: 0.1 10*3/uL (ref 0.0–0.7)
Eosinophil %: 0.6 %
Lymphocyte #: 1.3 10*3/uL (ref 1.0–3.6)
MCH: 28 pg (ref 26.0–34.0)
MCHC: 31.1 g/dL — ABNORMAL LOW (ref 32.0–36.0)
Monocyte #: 1.1 x10 3/mm — ABNORMAL HIGH (ref 0.2–1.0)
Neutrophil %: 71.1 %
Platelet: 189 10*3/uL (ref 150–440)
RBC: 4.85 10*6/uL (ref 4.40–5.90)
RDW: 18.2 % — ABNORMAL HIGH (ref 11.5–14.5)

## 2012-02-07 ENCOUNTER — Inpatient Hospital Stay: Payer: Self-pay | Admitting: Internal Medicine

## 2012-02-07 LAB — TSH: Thyroid Stimulating Horm: 0.491 u[IU]/mL

## 2012-02-07 LAB — CBC WITH DIFFERENTIAL/PLATELET
Basophil #: 0.1 10*3/uL (ref 0.0–0.1)
Basophil %: 0.7 %
HGB: 13.7 g/dL (ref 13.0–18.0)
Lymphocyte #: 1.7 10*3/uL (ref 1.0–3.6)
Lymphocyte %: 16.3 %
Monocyte %: 14.4 %
Neutrophil #: 7.2 10*3/uL — ABNORMAL HIGH (ref 1.4–6.5)
Platelet: 184 10*3/uL (ref 150–440)
RBC: 4.91 10*6/uL (ref 4.40–5.90)
RDW: 18.7 % — ABNORMAL HIGH (ref 11.5–14.5)
WBC: 10.7 10*3/uL — ABNORMAL HIGH (ref 3.8–10.6)

## 2012-02-07 LAB — COMPREHENSIVE METABOLIC PANEL
Anion Gap: 7 (ref 7–16)
Calcium, Total: 8.7 mg/dL (ref 8.5–10.1)
Chloride: 97 mmol/L — ABNORMAL LOW (ref 98–107)
Co2: 35 mmol/L — ABNORMAL HIGH (ref 21–32)
EGFR (African American): 33 — ABNORMAL LOW
EGFR (Non-African Amer.): 28 — ABNORMAL LOW
Potassium: 3.7 mmol/L (ref 3.5–5.1)
SGOT(AST): 486 U/L — ABNORMAL HIGH (ref 15–37)
SGPT (ALT): 579 U/L — ABNORMAL HIGH
Total Protein: 6.6 g/dL (ref 6.4–8.2)

## 2012-02-08 LAB — COMPREHENSIVE METABOLIC PANEL
Albumin: 2.9 g/dL — ABNORMAL LOW (ref 3.4–5.0)
Alkaline Phosphatase: 176 U/L — ABNORMAL HIGH (ref 50–136)
BUN: 47 mg/dL — ABNORMAL HIGH (ref 7–18)
Creatinine: 1.95 mg/dL — ABNORMAL HIGH (ref 0.60–1.30)
EGFR (Non-African Amer.): 33 — ABNORMAL LOW
Glucose: 253 mg/dL — ABNORMAL HIGH (ref 65–99)
SGOT(AST): 397 U/L — ABNORMAL HIGH (ref 15–37)
SGPT (ALT): 554 U/L — ABNORMAL HIGH
Total Protein: 6.9 g/dL (ref 6.4–8.2)

## 2012-02-08 LAB — URINALYSIS, COMPLETE
Bacteria: NONE SEEN
Bilirubin,UR: NEGATIVE
Blood: NEGATIVE
Glucose,UR: NEGATIVE mg/dL (ref 0–75)
Hyaline Cast: 18
Ketone: NEGATIVE
Specific Gravity: 1.012 (ref 1.003–1.030)
Squamous Epithelial: <1

## 2012-02-09 LAB — CBC WITH DIFFERENTIAL/PLATELET
Basophil %: 0.1 %
Eosinophil %: 0.2 %
HCT: 42.3 % (ref 40.0–52.0)
Lymphocyte %: 10.2 %
MCV: 89 fL (ref 80–100)
Monocyte %: 10.4 %
Neutrophil #: 10.7 10*3/uL — ABNORMAL HIGH (ref 1.4–6.5)
RBC: 4.76 10*6/uL (ref 4.40–5.90)
RDW: 18.3 % — ABNORMAL HIGH (ref 11.5–14.5)
WBC: 13.5 10*3/uL — ABNORMAL HIGH (ref 3.8–10.6)

## 2012-02-09 LAB — COMPREHENSIVE METABOLIC PANEL
Albumin: 2.6 g/dL — ABNORMAL LOW (ref 3.4–5.0)
Anion Gap: 10 (ref 7–16)
Calcium, Total: 8.4 mg/dL — ABNORMAL LOW (ref 8.5–10.1)
Chloride: 96 mmol/L — ABNORMAL LOW (ref 98–107)
Co2: 31 mmol/L (ref 21–32)
EGFR (African American): 54 — ABNORMAL LOW
EGFR (Non-African Amer.): 47 — ABNORMAL LOW
Glucose: 111 mg/dL — ABNORMAL HIGH (ref 65–99)
Potassium: 3.6 mmol/L (ref 3.5–5.1)
SGOT(AST): 272 U/L — ABNORMAL HIGH (ref 15–37)

## 2012-02-09 LAB — IRON AND TIBC: Iron: 26 ug/dL — ABNORMAL LOW (ref 65–175)

## 2012-02-10 LAB — COMPREHENSIVE METABOLIC PANEL
Anion Gap: 11 (ref 7–16)
Calcium, Total: 8.9 mg/dL (ref 8.5–10.1)
Chloride: 92 mmol/L — ABNORMAL LOW (ref 98–107)
EGFR (African American): >60
EGFR (Non-African Amer.): 53 — ABNORMAL LOW
Glucose: 48 mg/dL — ABNORMAL LOW (ref 65–99)
Potassium: 3.7 mmol/L (ref 3.5–5.1)
SGOT(AST): 202 U/L — ABNORMAL HIGH (ref 15–37)

## 2012-02-10 LAB — CBC WITH DIFFERENTIAL/PLATELET
Basophil #: 0 10*3/uL (ref 0.0–0.1)
Eosinophil #: 0 10*3/uL (ref 0.0–0.7)
HCT: 43.6 % (ref 40.0–52.0)
Lymphocyte #: 0.7 10*3/uL — ABNORMAL LOW (ref 1.0–3.6)
MCH: 28.2 pg (ref 26.0–34.0)
MCHC: 32 g/dL (ref 32.0–36.0)
MCV: 88 fL (ref 80–100)
Monocyte #: 1.2 x10 3/mm — ABNORMAL HIGH (ref 0.2–1.0)
Neutrophil #: 11.6 10*3/uL — ABNORMAL HIGH (ref 1.4–6.5)
Platelet: 210 10*3/uL (ref 150–440)
RBC: 4.94 10*6/uL (ref 4.40–5.90)
RDW: 18.4 % — ABNORMAL HIGH (ref 11.5–14.5)
WBC: 13.6 10*3/uL — ABNORMAL HIGH (ref 3.8–10.6)

## 2012-02-11 LAB — COMPREHENSIVE METABOLIC PANEL
Alkaline Phosphatase: 184 U/L — ABNORMAL HIGH (ref 50–136)
Bilirubin,Total: 1 mg/dL (ref 0.2–1.0)
Chloride: 92 mmol/L — ABNORMAL LOW (ref 98–107)
Co2: 39 mmol/L — ABNORMAL HIGH (ref 21–32)
Creatinine: 1.13 mg/dL (ref 0.60–1.30)
EGFR (African American): >60
EGFR (Non-African Amer.): >60
Osmolality: 291 (ref 275–301)
Potassium: 3.3 mmol/L — ABNORMAL LOW (ref 3.5–5.1)
Sodium: 143 mmol/L (ref 136–145)

## 2012-02-11 LAB — CBC WITH DIFFERENTIAL/PLATELET
Basophil %: 1.4 %
Eosinophil #: 0.1 10*3/uL (ref 0.0–0.7)
Eosinophil %: 0.5 %
HCT: 42.5 % (ref 40.0–52.0)
MCV: 88 fL (ref 80–100)
Monocyte #: 1.5 x10 3/mm — ABNORMAL HIGH (ref 0.2–1.0)
Neutrophil #: 11.2 10*3/uL — ABNORMAL HIGH (ref 1.4–6.5)
RBC: 4.81 10*6/uL (ref 4.40–5.90)
WBC: 14.1 10*3/uL — ABNORMAL HIGH (ref 3.8–10.6)

## 2012-02-12 LAB — BASIC METABOLIC PANEL
Calcium, Total: 8.5 mg/dL (ref 8.5–10.1)
Chloride: 95 mmol/L — ABNORMAL LOW (ref 98–107)
Co2: 38 mmol/L — ABNORMAL HIGH (ref 21–32)
Creatinine: 1.2 mg/dL (ref 0.60–1.30)
EGFR (African American): >60
EGFR (Non-African Amer.): >60
Osmolality: 289 (ref 275–301)
Potassium: 4.3 mmol/L (ref 3.5–5.1)

## 2012-02-12 LAB — CBC WITH DIFFERENTIAL/PLATELET
Basophil #: 0 10*3/uL (ref 0.0–0.1)
Basophil %: 0.3 %
Eosinophil #: 0.1 10*3/uL (ref 0.0–0.7)
HCT: 44.4 % (ref 40.0–52.0)
HGB: 13.8 g/dL (ref 13.0–18.0)
Lymphocyte %: 9.2 %
MCH: 28.1 pg (ref 26.0–34.0)
MCV: 91 fL (ref 80–100)
Monocyte #: 1.5 x10 3/mm — ABNORMAL HIGH (ref 0.2–1.0)
Monocyte %: 13.6 %
Platelet: 149 10*3/uL — ABNORMAL LOW (ref 150–440)
RDW: 18.7 % — ABNORMAL HIGH (ref 11.5–14.5)
WBC: 11 10*3/uL — ABNORMAL HIGH (ref 3.8–10.6)

## 2012-02-13 LAB — COMPREHENSIVE METABOLIC PANEL
Anion Gap: 5 — ABNORMAL LOW (ref 7–16)
BUN: 34 mg/dL — ABNORMAL HIGH (ref 7–18)
Calcium, Total: 8.6 mg/dL (ref 8.5–10.1)
Chloride: 91 mmol/L — ABNORMAL LOW (ref 98–107)
Co2: 44 mmol/L (ref 21–32)
Creatinine: 1.14 mg/dL (ref 0.60–1.30)
EGFR (African American): >60
EGFR (Non-African Amer.): >60
Potassium: 4.4 mmol/L (ref 3.5–5.1)
SGOT(AST): 163 U/L — ABNORMAL HIGH (ref 15–37)
Sodium: 140 mmol/L (ref 136–145)

## 2012-02-13 LAB — CBC WITH DIFFERENTIAL/PLATELET
Basophil %: 0.1 %
Eosinophil #: 0.1 10*3/uL (ref 0.0–0.7)
Eosinophil %: 0.6 %
HCT: 44 % (ref 40.0–52.0)
HGB: 13.4 g/dL (ref 13.0–18.0)
Lymphocyte #: 1.1 10*3/uL (ref 1.0–3.6)
Lymphocyte %: 7.8 %
MCH: 27.6 pg (ref 26.0–34.0)
MCHC: 30.6 g/dL — ABNORMAL LOW (ref 32.0–36.0)
MCV: 90 fL (ref 80–100)
Monocyte #: 1.5 x10 3/mm — ABNORMAL HIGH (ref 0.2–1.0)
Neutrophil #: 11 10*3/uL — ABNORMAL HIGH (ref 1.4–6.5)
RDW: 18.8 % — ABNORMAL HIGH (ref 11.5–14.5)

## 2012-02-14 LAB — CBC WITH DIFFERENTIAL/PLATELET
Basophil %: 0.1 %
Eosinophil #: 0.1 10*3/uL (ref 0.0–0.7)
HCT: 43.6 % (ref 40.0–52.0)
HGB: 13.5 g/dL (ref 13.0–18.0)
Lymphocyte %: 8.6 %
MCH: 28 pg (ref 26.0–34.0)
MCHC: 31 g/dL — ABNORMAL LOW (ref 32.0–36.0)
MCV: 91 fL (ref 80–100)
Neutrophil #: 10.9 10*3/uL — ABNORMAL HIGH (ref 1.4–6.5)
RDW: 18.2 % — ABNORMAL HIGH (ref 11.5–14.5)

## 2012-02-14 LAB — BASIC METABOLIC PANEL
Calcium, Total: 8.5 mg/dL (ref 8.5–10.1)
Co2: 43 mmol/L (ref 21–32)
Creatinine: 1.19 mg/dL (ref 0.60–1.30)
EGFR (African American): >60
EGFR (Non-African Amer.): >60
Osmolality: 288 (ref 275–301)

## 2012-02-15 LAB — COMPREHENSIVE METABOLIC PANEL
Albumin: 2.2 g/dL — ABNORMAL LOW (ref 3.4–5.0)
Alkaline Phosphatase: 189 U/L — ABNORMAL HIGH (ref 50–136)
Anion Gap: 4 — ABNORMAL LOW (ref 7–16)
BUN: 29 mg/dL — ABNORMAL HIGH (ref 7–18)
Bilirubin,Total: 1.7 mg/dL — ABNORMAL HIGH (ref 0.2–1.0)
Calcium, Total: 8.2 mg/dL — ABNORMAL LOW (ref 8.5–10.1)
Co2: 45 mmol/L (ref 21–32)
EGFR (African American): >60
EGFR (Non-African Amer.): 58 — ABNORMAL LOW
Glucose: 225 mg/dL — ABNORMAL HIGH (ref 65–99)
SGPT (ALT): 165 U/L — ABNORMAL HIGH
Sodium: 146 mmol/L — ABNORMAL HIGH (ref 136–145)
Total Protein: 5.6 g/dL — ABNORMAL LOW (ref 6.4–8.2)

## 2012-02-15 LAB — CBC WITH DIFFERENTIAL/PLATELET
Basophil %: 0.6 %
Eosinophil %: 0 %
HCT: 41.8 % (ref 40.0–52.0)
Lymphocyte #: 0.7 10*3/uL — ABNORMAL LOW (ref 1.0–3.6)
MCV: 88 fL (ref 80–100)
Monocyte #: 1.2 x10 3/mm — ABNORMAL HIGH (ref 0.2–1.0)
Monocyte %: 7 %
Neutrophil #: 15.6 10*3/uL — ABNORMAL HIGH (ref 1.4–6.5)
RBC: 4.77 10*6/uL (ref 4.40–5.90)
WBC: 17.6 10*3/uL — ABNORMAL HIGH (ref 3.8–10.6)

## 2012-02-15 LAB — PROTIME-INR
INR: 1.6
Prothrombin Time: 19.1 s — ABNORMAL HIGH

## 2012-02-15 LAB — APTT: Activated PTT: 31.7 secs (ref 23.6–35.9)

## 2012-02-15 LAB — PHOSPHORUS: Phosphorus: 2.6 mg/dL (ref 2.5–4.9)

## 2012-02-15 LAB — MAGNESIUM: Magnesium: 2.1 mg/dL

## 2012-02-16 LAB — CBC WITH DIFFERENTIAL/PLATELET
Basophil #: 0.1 10*3/uL (ref 0.0–0.1)
HGB: 13.7 g/dL (ref 13.0–18.0)
Lymphocyte #: 1.1 10*3/uL (ref 1.0–3.6)
Lymphocyte %: 5.4 %
MCH: 28.7 pg (ref 26.0–34.0)
MCHC: 32.9 g/dL (ref 32.0–36.0)
MCV: 87 fL (ref 80–100)
Monocyte #: 1.8 x10 3/mm — ABNORMAL HIGH (ref 0.2–1.0)
Platelet: 179 10*3/uL (ref 150–440)
RDW: 17.9 % — ABNORMAL HIGH (ref 11.5–14.5)

## 2012-02-16 LAB — BASIC METABOLIC PANEL
Anion Gap: 4 — ABNORMAL LOW (ref 7–16)
BUN: 41 mg/dL — ABNORMAL HIGH (ref 7–18)
Chloride: 100 mmol/L (ref 98–107)
Co2: 45 mmol/L (ref 21–32)
Creatinine: 1.32 mg/dL — ABNORMAL HIGH (ref 0.60–1.30)
Potassium: 4.1 mmol/L (ref 3.5–5.1)

## 2012-02-16 LAB — CULTURE, BLOOD (SINGLE)

## 2012-02-17 LAB — BASIC METABOLIC PANEL
Anion Gap: 3 — ABNORMAL LOW (ref 7–16)
Calcium, Total: 8.2 mg/dL — ABNORMAL LOW (ref 8.5–10.1)
Co2: 44 mmol/L (ref 21–32)
EGFR (African American): 54 — ABNORMAL LOW
EGFR (Non-African Amer.): 47 — ABNORMAL LOW
Glucose: 198 mg/dL — ABNORMAL HIGH (ref 65–99)
Osmolality: 309 (ref 275–301)
Sodium: 146 mmol/L — ABNORMAL HIGH (ref 136–145)

## 2012-02-17 LAB — CBC WITH DIFFERENTIAL/PLATELET
Basophil #: 0 10*3/uL (ref 0.0–0.1)
Basophil %: 0.2 %
Lymphocyte #: 1.2 10*3/uL (ref 1.0–3.6)
Lymphocyte %: 7.6 %
MCV: 88 fL (ref 80–100)
Monocyte %: 9 %
Neutrophil %: 81.6 %
Platelet: 147 10*3/uL — ABNORMAL LOW (ref 150–440)
RDW: 18.4 % — ABNORMAL HIGH (ref 11.5–14.5)

## 2012-02-18 LAB — BASIC METABOLIC PANEL
BUN: 42 mg/dL — ABNORMAL HIGH (ref 7–18)
Calcium, Total: 8.1 mg/dL — ABNORMAL LOW (ref 8.5–10.1)
Co2: 44 mmol/L (ref 21–32)
Creatinine: 1.33 mg/dL — ABNORMAL HIGH (ref 0.60–1.30)
EGFR (African American): >60
EGFR (Non-African Amer.): 53 — ABNORMAL LOW
Glucose: 202 mg/dL — ABNORMAL HIGH (ref 65–99)
Osmolality: 305 (ref 275–301)
Sodium: 145 mmol/L (ref 136–145)

## 2012-02-18 LAB — HEPATIC FUNCTION PANEL A (ARMC)
Bilirubin,Total: 2.6 mg/dL — ABNORMAL HIGH (ref 0.2–1.0)
SGOT(AST): 98 U/L — ABNORMAL HIGH (ref 15–37)
SGPT (ALT): 69 U/L
Total Protein: 5.2 g/dL — ABNORMAL LOW (ref 6.4–8.2)

## 2012-02-18 LAB — CBC WITH DIFFERENTIAL/PLATELET
Basophil #: 0 10*3/uL (ref 0.0–0.1)
Eosinophil #: 0.2 10*3/uL (ref 0.0–0.7)
Eosinophil %: 1.2 %
Lymphocyte %: 9.2 %
MCH: 27.9 pg (ref 26.0–34.0)
Monocyte #: 1 x10 3/mm (ref 0.2–1.0)
Monocyte %: 6.7 %
Neutrophil #: 11.8 10*3/uL — ABNORMAL HIGH (ref 1.4–6.5)
Neutrophil %: 82.7 %
Platelet: 153 10*3/uL (ref 150–440)
RBC: 4.69 10*6/uL (ref 4.40–5.90)
RDW: 18.1 % — ABNORMAL HIGH (ref 11.5–14.5)
WBC: 14.2 10*3/uL — ABNORMAL HIGH (ref 3.8–10.6)

## 2012-02-19 LAB — CBC WITH DIFFERENTIAL/PLATELET
Basophil %: 0 %
Eosinophil %: 0 %
HCT: 42.2 % (ref 40.0–52.0)
HGB: 14 g/dL (ref 13.0–18.0)
Lymphocyte #: 0.2 10*3/uL — ABNORMAL LOW (ref 1.0–3.6)
Lymphocyte %: 1.7 %
MCV: 86 fL (ref 80–100)
Monocyte %: 3 %
Neutrophil #: 14 10*3/uL — ABNORMAL HIGH (ref 1.4–6.5)
Platelet: 208 10*3/uL (ref 150–440)
WBC: 14.7 10*3/uL — ABNORMAL HIGH (ref 3.8–10.6)

## 2012-02-19 LAB — COMPREHENSIVE METABOLIC PANEL
Albumin: 1.8 g/dL — ABNORMAL LOW (ref 3.4–5.0)
Anion Gap: 9 (ref 7–16)
BUN: 52 mg/dL — ABNORMAL HIGH (ref 7–18)
Calcium, Total: 8.1 mg/dL — ABNORMAL LOW (ref 8.5–10.1)
Co2: 38 mmol/L — ABNORMAL HIGH (ref 21–32)
EGFR (Non-African Amer.): 41 — ABNORMAL LOW
Glucose: 388 mg/dL — ABNORMAL HIGH (ref 65–99)
Potassium: 4.9 mmol/L (ref 3.5–5.1)
SGOT(AST): 145 U/L — ABNORMAL HIGH (ref 15–37)
Sodium: 140 mmol/L (ref 136–145)
Total Protein: 5.9 g/dL — ABNORMAL LOW (ref 6.4–8.2)

## 2012-02-19 LAB — PHOSPHORUS: Phosphorus: 4.3 mg/dL (ref 2.5–4.9)

## 2012-02-19 LAB — MAGNESIUM: Magnesium: 3.2 mg/dL — ABNORMAL HIGH

## 2012-02-20 LAB — CBC WITH DIFFERENTIAL/PLATELET
Basophil #: 0 10*3/uL (ref 0.0–0.1)
Eosinophil #: 0 10*3/uL (ref 0.0–0.7)
Lymphocyte %: 3.8 %
MCHC: 34 g/dL (ref 32.0–36.0)
Monocyte #: 0.6 x10 3/mm (ref 0.2–1.0)
Monocyte %: 3.2 %
Neutrophil #: 18.5 10*3/uL — ABNORMAL HIGH (ref 1.4–6.5)
Neutrophil %: 93 %
Platelet: 240 10*3/uL (ref 150–440)
RDW: 17.6 % — ABNORMAL HIGH (ref 11.5–14.5)

## 2012-02-20 LAB — BASIC METABOLIC PANEL
BUN: 78 mg/dL — ABNORMAL HIGH (ref 7–18)
Creatinine: 1.75 mg/dL — ABNORMAL HIGH (ref 0.60–1.30)
EGFR (African American): 44 — ABNORMAL LOW
EGFR (Non-African Amer.): 38 — ABNORMAL LOW
Glucose: 207 mg/dL — ABNORMAL HIGH (ref 65–99)
Sodium: 139 mmol/L (ref 136–145)

## 2012-02-21 ENCOUNTER — Ambulatory Visit (HOSPITAL_COMMUNITY)
Admission: AD | Admit: 2012-02-21 | Payer: No Typology Code available for payment source | Source: Other Acute Inpatient Hospital | Admitting: Internal Medicine

## 2012-02-21 ENCOUNTER — Ambulatory Visit (HOSPITAL_COMMUNITY)
Admission: AD | Admit: 2012-02-21 | Discharge: 2012-02-21 | Disposition: A | Discharge status: Home / Self Care | Payer: No Typology Code available for payment source | Source: Other Acute Inpatient Hospital | Attending: Internal Medicine | Admitting: Internal Medicine

## 2012-02-21 ENCOUNTER — Other Ambulatory Visit (HOSPITAL_COMMUNITY): Payer: Self-pay

## 2012-02-21 ENCOUNTER — Inpatient Hospital Stay
Admission: AD | Admit: 2012-02-21 | Discharge: 2012-03-23 | Disposition: E | Payer: No Typology Code available for payment source | Source: Ambulatory Visit | Attending: Pulmonary Disease | Admitting: Pulmonary Disease

## 2012-02-21 ENCOUNTER — Ambulatory Visit: Payer: Self-pay | Admitting: Internal Medicine

## 2012-02-21 DIAGNOSIS — R6521 Severe sepsis with septic shock: Secondary | ICD-10-CM

## 2012-02-21 DIAGNOSIS — I5022 Chronic systolic (congestive) heart failure: Secondary | ICD-10-CM

## 2012-02-21 DIAGNOSIS — E663 Overweight: Secondary | ICD-10-CM

## 2012-02-21 DIAGNOSIS — A419 Sepsis, unspecified organism: Secondary | ICD-10-CM

## 2012-02-21 DIAGNOSIS — J80 Acute respiratory distress syndrome: Secondary | ICD-10-CM

## 2012-02-21 DIAGNOSIS — I4891 Unspecified atrial fibrillation: Secondary | ICD-10-CM

## 2012-02-21 DIAGNOSIS — J9600 Acute respiratory failure, unspecified whether with hypoxia or hypercapnia: Secondary | ICD-10-CM | POA: Insufficient documentation

## 2012-02-21 DIAGNOSIS — J449 Chronic obstructive pulmonary disease, unspecified: Secondary | ICD-10-CM

## 2012-02-21 DIAGNOSIS — J9601 Acute respiratory failure with hypoxia: Secondary | ICD-10-CM

## 2012-02-21 DIAGNOSIS — E119 Type 2 diabetes mellitus without complications: Secondary | ICD-10-CM | POA: Insufficient documentation

## 2012-02-21 DIAGNOSIS — Z95810 Presence of automatic (implantable) cardiac defibrillator: Secondary | ICD-10-CM

## 2012-02-21 LAB — CBC WITH DIFFERENTIAL/PLATELET
Basophil #: 0 10*3/uL (ref 0.0–0.1)
Basophil %: 0.2 %
Basophils Absolute: 0 10*3/uL (ref 0.0–0.1)
Basophils Relative: 0 % (ref 0–1)
Eosinophil #: 0 10*3/uL (ref 0.0–0.7)
Eosinophils Absolute: 0 10*3/uL (ref 0.0–0.7)
Eosinophils Relative: 0 % (ref 0–5)
HCT: 38.9 % — ABNORMAL LOW (ref 40.0–52.0)
HCT: 42.6 % (ref 39.0–52.0)
HGB: 12.9 g/dL — ABNORMAL LOW (ref 13.0–18.0)
Lymphocyte #: 0.2 10*3/uL — ABNORMAL LOW (ref 1.0–3.6)
Lymphocyte %: 1.1 %
Lymphocytes Relative: 2 % — ABNORMAL LOW (ref 12–46)
MCH: 27.6 pg (ref 26.0–34.0)
MCHC: 33.3 g/dL (ref 32.0–36.0)
MCHC: 31.9 g/dL (ref 30.0–36.0)
MCV: 86.6 fL (ref 78.0–100.0)
Monocyte #: 0.8 x10 3/mm (ref 0.2–1.0)
Monocytes Absolute: 1 10*3/uL (ref 0.1–1.0)
Neutrophil %: 94.5 %
Platelet: 252 10*3/uL (ref 150–440)
RBC: 4.56 10*6/uL (ref 4.40–5.90)
RDW: 17.6 % — ABNORMAL HIGH (ref 11.5–14.5)
RDW: 17.4 % — ABNORMAL HIGH (ref 11.5–15.5)

## 2012-02-21 LAB — APTT: Activated PTT: 23.5 secs — ABNORMAL LOW (ref 23.6–35.9)

## 2012-02-21 LAB — PROTIME-INR: INR: 1.48 (ref 0.00–1.49)

## 2012-02-21 LAB — COMPREHENSIVE METABOLIC PANEL
AST: 166 U/L — ABNORMAL HIGH (ref 0–37)
Anion Gap: 6 — ABNORMAL LOW (ref 7–16)
CO2: 37 mEq/L — ABNORMAL HIGH (ref 19–32)
Calcium, Total: 7.6 mg/dL — ABNORMAL LOW (ref 8.5–10.1)
Calcium: 7.9 mg/dL — ABNORMAL LOW (ref 8.4–10.5)
Chloride: 93 mmol/L — ABNORMAL LOW (ref 98–107)
Co2: 39 mmol/L — ABNORMAL HIGH (ref 21–32)
Creatinine, Ser: 1.25 mg/dL (ref 0.50–1.35)
Creatinine: 1.74 mg/dL — ABNORMAL HIGH (ref 0.60–1.30)
EGFR (African American): 44 — ABNORMAL LOW
EGFR (Non-African Amer.): 38 — ABNORMAL LOW
GFR calc non Af Amer: 56 mL/min — ABNORMAL LOW (ref >90)
Potassium: 4.8 mmol/L (ref 3.5–5.1)
SGOT(AST): 194 U/L — ABNORMAL HIGH (ref 15–37)
SGPT (ALT): 200 U/L — ABNORMAL HIGH
Total Protein: 5.5 g/dL — ABNORMAL LOW (ref 6.4–8.2)

## 2012-02-21 LAB — BLOOD GAS, ARTERIAL
Acid-Base Excess: 11.6 mmol/L — ABNORMAL HIGH (ref 0.0–2.0)
Bicarbonate: 37.4 mEq/L — ABNORMAL HIGH (ref 20.0–24.0)
FIO2: 100 %
O2 Saturation: 95.7 %
Patient temperature: 98.6
TCO2: 39.5 mmol/L (ref 0–100)

## 2012-02-21 LAB — MAGNESIUM: Magnesium: 4.8 mg/dL — ABNORMAL HIGH (ref 1.5–2.5)

## 2012-02-21 NOTE — Consult Note (Signed)
Name: Gary [name] MRN: [id number] DOB: April 25, 1941    LOS: 0  Referring Provider:  [name] Reason for Referral:  Acute resp failure  PULMONARY / CRITICAL CARE MEDICINE  HPI:  71 yr old COPD , CHFadmitted to [place] endovascualr repair for 4.6 cm. S/p repair AAA. To note , the surgery was deemed highrisk given underlying copd, co morbids. Required continue intubation / vent support post op , for post op resp failure. Failed to progress. Was seen by nephrology, had hypotension during course. Treated for apparent PNA. Course complicated by a fib, amio, steroids for stress steroids according to note. Pall care was also called. NOw comes to [facility] with ett, in continued shock dop at 20 mic andcontinues resp failure. Once here with vomious desat.  Past Medical History  Diagnosis Date  . Automatic implantable cardiac defibrillator in situ   . Sinoatrial node dysfunction   . First degree atrioventricular block   . Other specified cardiac dysrhythmias   . Other specified forms of chronic ischemic heart disease   . Chronic systolic heart failure   . Atrial flutter   . Overweight    Past Surgical History  Procedure Date  . Coronary artery bypass graft 2004  . Muscle flap surgery 2005   Prior to Admission medications   Medication Sig Start Date End Date Taking? Authorizing Provider  albuterol (PROVENTIL, VENTOLIN) (5 MG/ML) 0.5% NEBU Take 5 mg/hr by nebulization continuous as needed.      Historical Provider, MD  ALPRAZolam Prudy Feeler) 0.25 MG tablet Take 0.25 mg by mouth at bedtime as needed.    Historical Provider, MD  amiodarone (PACERONE) 200 MG tablet Take 200 mg by mouth 2 (two) times daily.    Historical Provider, MD  captopril (CAPOTEN) 25 MG tablet Take 25 mg by mouth 2 (two) times daily.      Historical Provider, MD  carvedilol (COREG) 25 MG tablet Take 12.5 mg by mouth 2 (two) times daily with a meal.     Historical Provider, MD  diltiazem (DILACOR XR) 180 MG 24 hr  capsule Take 180 mg by mouth daily.    Historical Provider, MD  ergocalciferol (VITAMIN D2) 50000 UNITS capsule Take 50,000 Units by mouth as directed.    Historical Provider, MD  furosemide (LASIX) 20 MG tablet Take 80 mg by mouth 2 (two) times daily.     Historical Provider, MD  insulin NPH-insulin regular (NOVOLIN 70/30) (70-30) 100 UNIT/ML injection Inject into the skin as directed.    Historical Provider, MD  meclizine (ANTIVERT) 25 MG tablet Take 25 mg by mouth as directed.    Historical Provider, MD  Multiple Vitamin (MULTIVITAMIN) tablet Take 1 tablet by mouth daily.      Historical Provider, MD  pantoprazole (PROTONIX) 40 MG tablet Take 40 mg by mouth 2 (two) times daily.     Historical Provider, MD  potassium chloride (KLOR-CON) 10 MEQ CR tablet Take 10 mEq by mouth daily.      Historical Provider, MD  predniSONE (DELTASONE) 5 MG tablet Take 5 mg by mouth daily.    Historical Provider, MD  sertraline (ZOLOFT) 100 MG tablet Take 100 mg by mouth daily.    Historical Provider, MD  simvastatin (ZOCOR) 40 MG tablet Take 40 mg by mouth at bedtime.      Historical Provider, MD  theophylline (THEO-24) 100 MG 24 hr capsule Take 100 mg by mouth 2 (two) times daily.    Historical Provider, MD  warfarin (COUMADIN) 1  MG tablet Take 1 mg by mouth as directed.      Historical Provider, MD   Allergies Allergies  Allergen Reactions  . Atorvastatin     REACTION: muscle aches  . Spironolactone     REACTION: burning sensation    Family History Family History  Problem Relation Age of Onset  . Heart disease Other    Social History  reports that he quit smoking about 9 years ago. His smoking use included Cigarettes. He has never used smokeless tobacco. He reports that he does not drink alcohol or use illicit drugs.  Review Of Systems:  Unable , vented, no fam  Brief patient description:  71 yr old COPD (home o2 3 lit) CHF, s/p hospital course endovascular repair AAA, muliple  complications.  Events Since Admission: 8/1- acute hypoxic failure, rissing peep, dopm 20 mics on arrival, cvp 4  Current Status: vented  Vital Signs:    Physical Examination: General:  dyschrony Neuro:  Opened eyes to voice, not following commands, moves upper ext HEENT:  ETT Neck:  Obese, short Cardiovascular:  s1 s2 rr tachy distant Lungs:  *diffuse ronchi Abdomen:  Soft, BS hypo, nt, mild distention Skin:  No rash  Active Problems:  * No active hospital problems. *    ASSESSMENT AND PLAN  PULMONARY  Lab 03/08/12 1448  PHART 7.359  PCO2ART 68.1*  PO2ART 76.2*  HCO3 37.4*  O2SAT 95.7   Ventilator Settings:   CXR:  Bilateral extensive infiltrates ETT:  7/23 [place]>>>  A:  Acute respiratory failure post AAA endovascualr repair , Terminal COPD, CHF, r/o ARDS, ro PNA P:   cvp low, indicates concern infiltrate / ARDS TV 500, keep plat les 30 abg reviewed, reduce rate slight in copd, avoid autopeep Peep increase to 10 , desaturation, goal to 60% if able abg in am  pcxr in am  See global issues Steroids for rel AI Add alb / atrovent  CARDIOVASCULAR No results found for this basename: TROPONINI:5,LATICACIDVEN:5, O2SATVEN:5,PROBNP:5 in the last 168 hours Lines: PICC [place] date?>>>  A: SHOCK, r/o septic, r/o cardiogenic, r/o slight sedation contribution, r/o post op MI / stress ishcemia P: consider echo Reduce dopamine to MAP goal 6 cvp low, give volume, follow pressor needs Add stress hydrocortisone Consider cardiac enzymes, echo Consider transition to levophed for MAP support Assess lactic acid  NEURO: A: r/o delirium, r/o CVA Consider head CT Would wean versed to intermittent, monitor for WD and not dc benzo as xanax at home High risk WD fent Assess cam scoring Family describes NO WUA at [facility]  ID Would treat VAP consider add ceftaz, vanc Assess BC, sputum  BEST PRACTICE / DISPOSITION Level of Care:  LTACH, critically ill , high  acuity, may need transfer to [facility] Primary Service:  Hospitalists Consultants:  PCCM Code Status:  Full, see below Diet:  npo DVT Px:  Add sub q hep GI Px:  ppi Social / Family:  I had an extensive d/w family, kids,wife. I described MODS in a pt with TErminal COPD. We are unlikley to liberate him from vent and we should consider comfort care. Janina Mayo would be medically ineffective and futile. I also suggested full ncb, they will think about it.  Ccm time 45 min   Mcarthur Rossetti. Tyson Alias, MD, FACP Pgr: [phone] [facility]   Nelda Bucks., M.D. Pulmonary and Critical Care Medicine [facility] Pager: [phone]  03-08-2012, 4:33 PM

## 2012-02-22 ENCOUNTER — Other Ambulatory Visit (HOSPITAL_COMMUNITY): Payer: Self-pay

## 2012-02-22 DIAGNOSIS — I82409 Acute embolism and thrombosis of unspecified deep veins of unspecified lower extremity: Secondary | ICD-10-CM

## 2012-02-22 LAB — LIPID PANEL
Cholesterol: 148 mg/dL (ref 0–200)
HDL: 5 mg/dL — ABNORMAL LOW (ref >39)
Total CHOL/HDL Ratio: 29.6 RATIO

## 2012-02-22 LAB — BASIC METABOLIC PANEL
CO2: 37 mEq/L — ABNORMAL HIGH (ref 19–32)
Calcium: 7.4 mg/dL — ABNORMAL LOW (ref 8.4–10.5)
Creatinine, Ser: 1.49 mg/dL — ABNORMAL HIGH (ref 0.50–1.35)
Glucose, Bld: 164 mg/dL — ABNORMAL HIGH (ref 70–99)

## 2012-02-22 LAB — BLOOD GAS, ARTERIAL
Acid-Base Excess: 10 mmol/L — ABNORMAL HIGH (ref 0.0–2.0)
FIO2: 0.5 %
MECHVT: 500 mL
Patient temperature: 101.8
RATE: 14 resp/min
TCO2: 36.6 mmol/L (ref 0–100)

## 2012-02-22 LAB — CBC
Hemoglobin: 12.3 g/dL — ABNORMAL LOW (ref 13.0–17.0)
MCH: 27.4 pg (ref 26.0–34.0)
MCV: 86.6 fL (ref 78.0–100.0)
RBC: 4.49 MIL/uL (ref 4.22–5.81)

## 2012-02-22 LAB — MAGNESIUM: Magnesium: 4.9 mg/dL — ABNORMAL HIGH (ref 1.5–2.5)

## 2012-02-22 NOTE — Consult Note (Signed)
Name: Gary [name] MRN: [id number] DOB: 09-09-1940    LOS: 1  Referring Provider:  [name] Reason for Referral:  Acute resp failure  PULMONARY / CRITICAL CARE MEDICINE   Brief patient description:  71 yr old COPD (home o2 3 lit) CHF, s/p hospital course endovascular repair AAA, muliple complications.  Events Since Admission: 8/1- acute hypoxic failure, rissing peep, dopm 20 mics on arrival, cvp 4 8/2- remain sin shock, peep 10, urine output ok  Current Status: vented, Shock  Vital Signs:  dop at 13 mics, MAP 60, HR 113, temp spike 102 rr 24  Physical Examination: General:  dyschrony Neuro:  Opened eyes to voice, rass -2 HEENT:  ETT Neck:  Obese, short Cardiovascular:  s1 s2 rr tachy distant Lungs:  *diffuse ronchi severe Abdomen:  Soft, BS hypo, nt, mild distention Skin:  No rash  Active Problems:  Acute respiratory failure with hypoxia  Septic shock  COPD (chronic obstructive pulmonary disease)  ARDS (adult respiratory distress syndrome)   ASSESSMENT AND PLAN  PULMONARY  Lab 02/22/12 0417 04-Mar-2012 1448  PHART 7.389 7.359  PCO2ART 60.5* 68.1*  PO2ART 75.2* 76.2*  HCO3 34.9* 37.4*  O2SAT 94.6 95.7   Ventilator Settings:   CXR:  8/1 Bilateral extensive infiltrates, pacer, line wnl ETT:  7/23 [place]>>>  A:  Acute respiratory failure post AAA endovascualr repair , Terminal COPD, CHF, r/o ARDS, ro PNA P:   ABg reviewed last, still hypoxic, requires peep  If to 40% then peep to 8 pcxr in am  Maintain current MV Keep plat less 30, consider reduction in TV Am- abg pcxr to follow Janina Mayo would be medically ineffective  CARDIOVASCULAR No results found for this basename: TROPONINI:5,LATICACIDVEN:5, O2SATVEN:5,PROBNP:5 in the last 168 hours Lines: PICC [place] date?>>>  A: SHOCK, r/o septic, r/o cardiogenic, r/o slight sedation contribution, r/o post op MI / stress ishcemia, r/o endocarditis, r/o line P: echo awaited If BC pos, dc lines Low  threshold to dc rt ij At [facility] endocarditis, s/p endovascular infection Add levophed then taper dop to off Continue stress steroids Low threshold addition vasopressin  NEURO: A: r/o delirium, r/o CVA Consider head CT if not improved fent Family describes NO WUA at [place]  ID LIKELY VAP as source continue imipenem, add vanc STAT Low threshold dc line Echo for veg, s/p endovascular infection  BEST PRACTICE / DISPOSITION Level of Care:  LTACH Primary Service:  Hospitalists Consultants:  PCCM Code Status:  Full, see below Diet:  npo DVT Px:  Add sub q hep GI Px:  ppi Social / Family:  I had an extensive d/w family, kids,wife. I described MODS in a pt with TErminal COPD. We are unlikley to liberate him from vent and we should consider comfort care. Trach , ACLS, pressors are medically ineffective and futile. I also suggested full ncb, await response  Ccm time 35 min   Mcarthur Rossetti. Tyson Alias, MD, FACP Pgr: [phone] [place] [facility]   Nelda Bucks., M.D. Pulmonary and Critical Care Medicine [facility] Pager: [phone]  02/22/2012, 1:40 PM

## 2012-02-22 NOTE — Progress Notes (Signed)
Bilateral lower extremity venous duplex completed.  Preliminary report is no obvious evidence of DVT, SVT, or a Baker's cyst.  Technically difficult study due to the patient's body habitus. 

## 2012-02-23 ENCOUNTER — Other Ambulatory Visit (HOSPITAL_COMMUNITY): Payer: Self-pay

## 2012-02-23 LAB — BASIC METABOLIC PANEL
BUN: 83 mg/dL — ABNORMAL HIGH (ref 6–23)
CO2: 27 mEq/L (ref 19–32)
Chloride: 97 mEq/L (ref 96–112)
Glucose, Bld: 376 mg/dL — ABNORMAL HIGH (ref 70–99)
Potassium: 3.5 mEq/L (ref 3.5–5.1)

## 2012-02-23 LAB — CBC
Hemoglobin: 10.7 g/dL — ABNORMAL LOW (ref 13.0–17.0)
MCH: 27.5 pg (ref 26.0–34.0)
MCV: 88.9 fL (ref 78.0–100.0)
Platelets: 292 10*3/uL (ref 150–400)
RBC: 3.89 MIL/uL — ABNORMAL LOW (ref 4.22–5.81)

## 2012-02-24 LAB — CBC
Hemoglobin: 10.4 g/dL — ABNORMAL LOW (ref 13.0–17.0)
MCH: 27.4 pg (ref 26.0–34.0)
MCHC: 30.6 g/dL (ref 30.0–36.0)
Platelets: 314 10*3/uL (ref 150–400)
RBC: 3.8 MIL/uL — ABNORMAL LOW (ref 4.22–5.81)

## 2012-02-24 LAB — BASIC METABOLIC PANEL
CO2: 31 mEq/L (ref 19–32)
Calcium: 7.1 mg/dL — ABNORMAL LOW (ref 8.4–10.5)
GFR calc non Af Amer: 65 mL/min — ABNORMAL LOW (ref >90)
Glucose, Bld: 451 mg/dL — ABNORMAL HIGH (ref 70–99)
Potassium: 3.9 mEq/L (ref 3.5–5.1)
Sodium: 138 mEq/L (ref 135–145)

## 2012-02-24 LAB — CULTURE, RESPIRATORY W GRAM STAIN

## 2012-02-24 LAB — PROCALCITONIN: Procalcitonin: 0.88 ng/mL

## 2012-02-24 NOTE — Progress Notes (Signed)
  Echocardiogram 2D Echocardiogram has been performed.  Gary [name] 02/24/2012, 3:54 PM

## 2012-02-24 NOTE — Progress Notes (Signed)
Name: Gary [name] MRN: [id number] DOB: Apr 27, 1941    LOS: 3  Referring Provider:  [name] Reason for Referral:  Acute resp failure  PULMONARY / CRITICAL CARE MEDICINE   Brief patient description:  71 yo with COPD on home oxygen and CHF, s/p endovascular repair AAA with muliple complications  Events Since Admission: 8/1  Acute hypoxemic respiratory failure, rising PEEP, Dopa 20 mcg on arrival, CVP 4 8/2  Remains in shock, PEEP 10, urine output ok 8/4  Fluids KVO's, wound culture grown Pseudomonas, Cipro started for double coverage, Dopamine off - Levophed started, Precedex off - fentanyl started  Current Status: Mechanically ventilated on vasopressors  Vital Signs:  Reviewed  Physical Examination: General:  Mechanically ventilated, synchronous Neuro:  Nonverbal, opens eyes spontaneously, does not follow commands HEENT:  ETT Neck:  No JVD Cardiovascular:  RRR, tachicardiac Lungs:  Bilateral air entry, scattered rhonchi Abdomen:  Soft, obese, decreased breath sounds Skin:  No rash  Labs:  Lab 02/24/12 0620 02/23/12 0554 02/22/12 0600 03/08/2012 1944  HGB 10.4* 10.7* 12.3* --  HCT 34.0* 34.6* 38.9* --  WBC 27.7* 20.5* 17.6* --  PLT 314 292 267 --  NA 138 137 137 134*  K 3.9 3.5 -- --  CL 99 97 95* 91*  CO2 31 27 37* 37*  GLUCOSE 451* 376* 164* 325*  BUN 68* 83* 93* 85*  CREATININE 1.10 1.42* 1.49* 1.25  CALCIUM 7.1* 7.1* 7.4* 7.9*  MG -- 4.9* 4.9* 4.8*  PHOS -- -- -- --  AST -- -- -- 166*  ALT -- -- -- 211*  ALKPHOS -- -- -- 281*  BILITOT -- -- -- 2.1*  PROT -- -- -- 5.6*  ALBUMIN -- -- -- 2.0*  INR -- -- -- 1.48  APTT -- -- -- 26  INR -- -- -- 1.48  LATICACIDVEN -- -- -- --  TROPONINI -- -- -- --    Lab 02/22/12 0417 03/04/2012 1448  PHART 7.389 7.359  PCO2ART 60.5* 68.1*  PO2ART 75.2* 76.2*  HCO3 34.9* 37.4*  TCO2 36.6 39.5  O2SAT 94.6 95.7   ASSESSMENT AND PLAN  PULMONARY  CXR:  8/3 Stable hardware, bilateral interstitial / alveolar  densities ETT:  7/23 >>>  A:  Acute respiratory failure post AAA endovascualr repair.  GOLD stage IV COPD, no evidence of exacerbation.  Pulmonary edema vs pneumonia vs ARDS. P:   Goal SpO2>92, pH>7.30, PLP<30 Full ventilatory support ABG PRN Trend CXR Fluids KVO'd, consider diuresis when off vasopressors Trach would be medically ineffective  CARDIOVASCULAR  Lines: PICC ??? >>> TTE:  Reportedly preserved LV function without overt valvular abnormalities. Final report pending.  A: Shock (septic vs cardiogenic). At [facility] endocarditis, s/p endovascular infection.  Congestive heart failure. P: Continue Levophed, suspect would be able titrate to off shortly If increased vasopressor requirements consider Vasopressin / stress steroids If blood cultures positive d/c PICC  RENAL  A:  Improving renal function.  Uremia. P:   Trend BMP  HEMATOLOGY A:  Stable anemia. P:   Trend CBC  ENDOCRINE  A:  Hyperglycemia. P: SSI/CBG  NEURO  A: Acute encephalopathy (septic, metabolic, uremic) P: Consider head CT if not improved shortly Fentanyl for synchrony  ID A:  Likely VAP. Endocarditis?  Endovascular graft infection? P: Zosyn / Vanco / Cipro TEE pending - vegetation?  BEST PRACTICE / DISPOSITION Level of Care:  LTACH Primary Service:  Hospitalists Consultants:  PCCM Code Status:  Full Diet:  NPO DVT Px:  Heparin GI Px:  Protonix Social / Family:  DF had an extensive discussion with family regarding goals of care and expressed that it is [place] that patient would be liberated from vent. Comfort measures were suggested.  Awaiting response.  The patient is critically ill with multiple organ systems failure and requires high complexity decision making for assessment and support, frequent evaluation and titration of therapies, application of advanced monitoring technologies and extensive interpretation of multiple databases. Critical Care Time devoted to patient care services  described in this note is 30 minutes.  Lonia Farber, M.D. Pulmonary and Critical Care Medicine [facility] Pager: [phone]  02/24/2012, 3:23 PM

## 2012-02-25 ENCOUNTER — Other Ambulatory Visit (HOSPITAL_COMMUNITY): Payer: Self-pay

## 2012-02-25 DIAGNOSIS — I5022 Chronic systolic (congestive) heart failure: Secondary | ICD-10-CM

## 2012-02-25 DIAGNOSIS — I4891 Unspecified atrial fibrillation: Secondary | ICD-10-CM

## 2012-02-25 DIAGNOSIS — R6520 Severe sepsis without septic shock: Secondary | ICD-10-CM

## 2012-02-25 DIAGNOSIS — J9600 Acute respiratory failure, unspecified whether with hypoxia or hypercapnia: Secondary | ICD-10-CM

## 2012-02-25 DIAGNOSIS — J449 Chronic obstructive pulmonary disease, unspecified: Secondary | ICD-10-CM

## 2012-02-25 DIAGNOSIS — R509 Fever, unspecified: Secondary | ICD-10-CM

## 2012-02-25 DIAGNOSIS — J9589 Other postprocedural complications and disorders of respiratory system, not elsewhere classified: Secondary | ICD-10-CM

## 2012-02-25 DIAGNOSIS — A419 Sepsis, unspecified organism: Secondary | ICD-10-CM

## 2012-02-25 DIAGNOSIS — R6521 Severe sepsis with septic shock: Secondary | ICD-10-CM

## 2012-02-25 LAB — CBC
Platelets: 389 10*3/uL (ref 150–400)
RBC: 3.55 MIL/uL — ABNORMAL LOW (ref 4.22–5.81)
RDW: 17.1 % — ABNORMAL HIGH (ref 11.5–15.5)
WBC: 22.9 10*3/uL — ABNORMAL HIGH (ref 4.0–10.5)

## 2012-02-25 LAB — WOUND CULTURE: Gram Stain: NONE SEEN

## 2012-02-25 NOTE — H&P (Signed)
[facility] Infect[facility] Disease Consult Note Date: 02/25/2012  Patient name: Gary [name] Medical record number: [id number] Date of birth: 11-22-1940 Age: 71 y.o. Gender: male PCP: Barbette Reichmann, MD  Medical Service: Infectious Disease Consult Reason for Consult: Fever, leukocytosis Requesting Physician: Dr. Sharyon Medicus  Attending physician: Dr. Daiva Eves     Reason for consult: suspected Ventilator Associated Pneumonia   History of Present Illness: 71 yo male with a PMH of DM, HTN, chronic A fib, end stage COPD, sleep apnea on nightly BiPAP, presented to his doctor's office with intractable nausea, vomiting, weight loss, near syncope, poor appetite. He was found to have increased LFTs and went to the ED. 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 AAA was repaired on 7/25, was found to have respiratory failure and hypotensive and had to be intubated after the procedure. Pt had a high FiO2 requirement and was not responsive to steroids and levaquine and could not be weaned off of ventilator. Because of the hypotension, ACE inhibitors were held and Dopamine was given. Pt was unable to respond to verbal commands. Pt continued to have fevers. Pt required long term ventilation and therefore was transferred to [facility].  Meds: Levaquin 02/19/12--02/20/12, 02/25/12--> Imipenem 03/03/2012-->  Ciprofloxacin 02/24/12--->02/25/12 Vancomycin 02/24/12---> Flagyl 02/24/12---> I Dopamine Versed Fentanyl Ipratropium Solu-Medrol Cardizem 30 PO q 6hrs ASA 81mg  Milk of Magnesia Amiodarone 200mg  q daily Vit C    Allergies: Allergies as of 03/16/2012 - Review Complete 12/11/2011  Allergen Reaction Noted  . Atorvastatin    . Spironolactone     Past Medical History  Diagnosis Date  . Automatic implantable cardiac defibrillator in situ   . Sinoatrial node dysfunction   . First degree atrioventricular block   . Other specified cardiac  dysrhythmias   . Other specified forms of chronic ischemic heart disease   . Chronic systolic heart failure   . Atrial flutter   . Overweight    Past Surgical History  Procedure Date  . Coronary artery bypass graft 2004  . Muscle flap surgery 2005   Family History  Problem Relation Age of Onset  . Heart disease Other    History   Social History  . Marital Status: Married    Spouse Name: N/A    Number of Children: N/A  . Years of Education: N/A   Occupational History  . Not on file.   Social History Main Topics  . Smoking status: Former Smoker    Types: Cigarettes    Quit date: 07/23/2002  . Smokeless tobacco: Never Used  . Alcohol Use: No  . Drug Use: No  . Sexually Active:    Other Topics Concern  . Not on file   Social History Narrative  . No narrative on file    Review of Systems: Pt is not responsive   Physical Exam: Vitals: 75 systolic, HR 88, RR 16, 92% on FiO2 of 0.8 General: arousable squeezes hands to command HEENT: thick neck,  Cardiac: RRR, no rubs, murmurs or gallops Pulm: course breath sounds b/l, rhonchi  Abd: diffusely TENDer, positive bowel sounds  Skin: multiple echymoses, drainage from bilateral groin sites from endovascular access Neuro: nonfocal    Lab Results:  [facility] 02/25/12 0509 02/24/12 0620  WBC 22.9* 27.7*  HGB 10.2* 10.4*  HCT 32.5* 34.0*  PLT 389 314    BMET  Basename 02/24/12 0620 02/23/12 0554  NA 138 137  K 3.9 3.5  CL  99 97  CO2 31 27  GLUCOSE 451* 376*  BUN 68* 83*  CREATININE 1.10 1.42*  CALCIUM 7.1* 7.1*    Micro Results: Recent Results (from the past 240 hour(s))  CULTURE, BLOOD (ROUTINE X 2)     Status: Normal (Preliminary result)   Collection Time   2012-02-22  7:10 PM      Component Value Range Status Comment   Specimen Description BLOOD RIGHT FOOT   Final    Special Requests BOTTLES DRAWN AEROBIC ONLY 7CC   Final    Culture  Setup Time 02/22/2012 02:15   Final    Culture     Final     Value:        BLOOD CULTURE RECEIVED NO GROWTH TO DATE CULTURE WILL BE HELD FOR 5 DAYS BEFORE ISSUING A FINAL NEGATIVE REPORT   Report Status PENDING   Incomplete   CULTURE, BLOOD (ROUTINE X 2)     Status: Normal (Preliminary result)   Collection Time   2012/02/22  7:19 PM      Component Value Range Status Comment   Specimen Description BLOOD RIGHT ANKLE   Final    Special Requests     Final    Value: BOTTLES DRAWN AEROBIC AND ANAEROBIC BLUE 7CC RED 5CC   Culture  Setup Time 02/22/2012 02:15   Final    Culture     Final    Value:        BLOOD CULTURE RECEIVED NO GROWTH TO DATE CULTURE WILL BE HELD FOR 5 DAYS BEFORE ISSUING A FINAL NEGATIVE REPORT   Report Status PENDING   Incomplete   CULTURE, RESPIRATORY     Status: Normal   Collection Time   02/22/2012  9:40 PM      Component Value Range Status Comment   Specimen Description TRACHEAL ASPIRATE   Final    Special Requests NONE   Final    Gram Stain     Final    Value: FEW WBC PRESENT,BOTH PMN AND MONONUCLEAR     NO SQUAMOUS EPITHELIAL CELLS SEEN     NO ORGANISMS SEEN   Culture FEW STENOTROPHOMONAS MALTOPHILIA   Final    Report Status 02/24/2012 FINAL   Final    Organism ID, Bacteria STENOTROPHOMONAS MALTOPHILIA   Final   CULTURE, BLOOD (ROUTINE X 2)     Status: Normal (Preliminary result)   Collection Time   2012-02-22 11:00 PM      Component Value Range Status Comment   Specimen Description BLOOD CENTRAL LINE   Final    Special Requests BOTTLES DRAWN AEROBIC AND ANAEROBIC 10CC RIGHT IJ   Final    Culture  Setup Time 02/22/2012 03:11   Final    Culture     Final    Value:        BLOOD CULTURE RECEIVED NO GROWTH TO DATE CULTURE WILL BE HELD FOR 5 DAYS BEFORE ISSUING A FINAL NEGATIVE REPORT   Report Status PENDING   Incomplete   WOUND CULTURE     Status: Normal   Collection Time   02/22/12  1:14 AM      Component Value Range Status Comment   Specimen Description WOUND LEFT GROIN   Final    Special Requests NONE   Final    Gram Stain      Final    Value: NO WBC SEEN     FEW SQUAMOUS EPITHELIAL CELLS PRESENT     NO ORGANISMS SEEN   Culture FEW  PSEUDOMONAS AERUGINOSA   Final    Report Status 02/25/2012 FINAL   Final    Organism ID, Bacteria PSEUDOMONAS AERUGINOSA   Final   WOUND CULTURE     Status: Normal   Collection Time   02/22/12  2:13 AM      Component Value Range Status Comment   Specimen Description WOUND RIGHT GROIN   Final    Special Requests NONE   Final    Gram Stain     Final    Value: NO WBC SEEN     FEW SQUAMOUS EPITHELIAL CELLS PRESENT     NO ORGANISMS SEEN   Culture FEW PSEUDOMONAS AERUGINOSA   Final    Report Status 02/25/2012 FINAL   Final    Organism ID, Bacteria PSEUDOMONAS AERUGINOSA   Final   CULTURE, BLOOD (SINGLE)     Status: Normal (Preliminary result)   Collection Time   02/22/12  9:20 AM      Component Value Range Status Comment   Specimen Description BLOOD   Final    Special Requests     Final    Value: BOTTLES DRAWN AEROBIC AND ANAEROBIC 10CC WHITE PORT CVP   Culture  Setup Time 02/22/2012 16:52   Final    Culture     Final    Value:        BLOOD CULTURE RECEIVED NO GROWTH TO DATE CULTURE WILL BE HELD FOR 5 DAYS BEFORE ISSUING A FINAL NEGATIVE REPORT   Report Status PENDING   Incomplete     Studies/Results: Dg Chest Port 1 View  02/25/2012  *RADIOLOGY REPORT*  Clinical Data: PICC line placement.  PORTABLE CHEST - 1 VIEW  Comparison: 02/25/2012.  Findings: The endotracheal tube, NG tube and right IJ catheters are stable.  The pacer wires are stable.  There is a left-sided PICC line with its tip overlying the left lung apex.  I suspect this is in a branch of the subclavian vein.   Stable diffuse airspace process and probable left effusion.  IMPRESSION:  Left-sided PICC line tip appears to be in a branch of the left subclavian vein.  Recommend repositioning.  Original Report Authenticated By: P. Loralie Champagne, M.D.   Dg Chest Port 1 View  02/25/2012  *RADIOLOGY REPORT*  Clinical Data:  Intubation, ileus, respiratory failure  PORTABLE CHEST - 1 VIEW  Comparison: Chest radiograph 02/23/2012  Findings: Patient rotated rightward.  Endotracheal tube, NG tube, and right central venous line unchanged.  There are decreased lung volumes compared to prior.  There is new diffuse bilateral air space disease which is not improved.  Bibasilar atelectasis.  IMPRESSION:  1.  Stable support apparatus. 2.  Decreased lung volumes. 3.  Bilateral air space disease suggesting pulmonary edema is not improved.  Original Report Authenticated By: Genevive Bi, M.D.        CBC:  Impression and Recommendations:  Pt continues to have fevers, hypotension, and can not be weaned off ventilation. We are concerned about possible bacteremia vs. AAA repair infection vs. Ventilation associated pneumonia. Source of infection is unknown, as is any specific  Bacterial culprit for the patient's continued poor health status.   1. Persistent Fevers and hypotension: Differential includes AAA endovascular infection, intrabdominal abscess, VAP, drug fever, wound infection less likely. No diarrhea or evidence of ileus to suggest C difficile infection  --Repeat blood cx X2 pending, repeat BAL culture --agree with US abdomen --doubt TEE would be able to assess the AAA site --CT Angiogram to examine  AAA site and look at abdomen would be ideal but not feasable at present given pressor requirements  --will narrow to levaquin (will have activity vs Stenotrophomonas), vancomycin and flagyl for now --dc imipenem  2. ? VAP: S maltophila may simply reflect colonization and antibiotic pressure for R organisms. Will narrow as above Repeat BAL (non bronch)   2 wounds withPseudomonas =s likely a colonizer   4. S/p AAA repair -questionable infected AAA repair.  -see above  This is a Psychologist, occupational Note.  The care of the patient was discussed with Dr. Daiva Eves and the assessment and plan was formulated with their  assistance.  Please see their note for official documentation of the patient encounter.   SignedVerlin Grills 02/25/2012, 3:46 PM    Infectious Diseases Attending Note Date: 02/25/2012  Patient name: Gary [name]  Medical record number: [id number]  Date of birth: [birth year]    This patient has been seen and discussed with the house staff. Please see their note for complete details. I concur with their findings with the following additions/corrections:  71 year old with multiple medical problems who underwent AAA endovascular repair complicated by hypotension and shock. I called [facility] and he has blood cultures from the 19th and 22nd from blood that are NO GROWTH but no micro data beyond that.  I worry about infection of his AAA site but CT angiogram is not easy in current predicament. He could certainly have VAP.  Agree with US abdomen, reculture blood and non Bronch BAL  Will narrow to levaquin at 750 mg loading dose and per pharmacy with antipseudomonal dosing, vancomycin and flagyl.  I believe his prognosis is grim and encouraged family to continue to visit goals of care with primary team and CCM in particular.  I spent greater than 60 minutes with the patient including greater than 50% of time in face to face counsel of the patient and in coordination of their care.    Acey Lav 02/25/2012, 5:57 PM

## 2012-02-25 NOTE — Progress Notes (Signed)
Name: Gary [name] MRN: [id number] DOB: 1941/01/28    LOS: 4  Referring Provider:  [name] Reason for Referral:  Acute resp failure  PULMONARY / CRITICAL CARE MEDICINE   Brief patient description:  71 yo with COPD on home oxygen and CHF, s/p endovascular repair AAA with muliple complications  Events Since Admission: 8/1  Acute hypoxemic respiratory failure, rising PEEP, Dopa 20 mcg on arrival, CVP 4 8/2  Remains in shock, PEEP 10, urine output ok 8/4  Fluids KVO's, wound culture grown Pseudomonas, Cipro started for double coverage, Dopamine off - Levophed started, Precedex off - fentanyl started  Current Status: Mechanically ventilated on vasopressors  Vital Signs:  Reviewed  Physical Examination: General:  Mechanically ventilated, synchronous Neuro:  Nonverbal, opens eyes spontaneously, does not follow commands HEENT:  ETT Neck:  No JVD Cardiovascular:  RRR, tachicardiac Lungs:  Bilateral air entry, scattered rhonchi Abdomen:  Soft, obese, decreased breath sounds Skin:  No rash  Labs:  Lab 02/25/12 0509 02/24/12 0620 02/23/12 0554 02/22/12 0600 02/27/2012 1944  HGB 10.2* 10.4* 10.7* -- --  HCT 32.5* 34.0* 34.6* -- --  WBC 22.9* 27.7* 20.5* -- --  PLT 389 314 292 -- --  NA -- 138 137 137 134*  K -- 3.9 3.5 -- --  CL -- 99 97 95* 91*  CO2 -- 31 27 37* 37*  GLUCOSE -- 451* 376* 164* 325*  BUN -- 68* 83* 93* 85*  CREATININE -- 1.10 1.42* 1.49* 1.25  CALCIUM -- 7.1* 7.1* 7.4* 7.9*  MG -- -- 4.9* 4.9* 4.8*  PHOS -- -- -- -- --  AST -- -- -- -- 166*  ALT -- -- -- -- 211*  ALKPHOS -- -- -- -- 281*  BILITOT -- -- -- -- 2.1*  PROT -- -- -- -- 5.6*  ALBUMIN -- -- -- -- 2.0*  INR -- -- -- -- 1.48  APTT -- -- -- -- 26  INR -- -- -- -- 1.48  LATICACIDVEN -- -- -- -- --  TROPONINI -- -- -- -- --    Lab 02/22/12 0417 02/28/2012 1448  PHART 7.389 7.359  PCO2ART 60.5* 68.1*  PO2ART 75.2* 76.2*  HCO3 34.9* 37.4*  TCO2 36.6 39.5  O2SAT 94.6 95.7   ASSESSMENT AND  PLAN  PULMONARY  CXR:  8/3 Stable hardware, bilateral interstitial / alveolar densities ETT:  7/23 >>>  A:  Acute respiratory failure post AAA endovascualr repair.  GOLD stage IV COPD, no evidence of exacerbation.  Pulmonary edema vs pneumonia vs ARDS. P:  Goal SpO2>92, pH>7.30, PLP<30, down to 50 and 8 right now. Full ventilatory support. Trend CXR Fluids KVO'd, consider diuresis when off vasopressors Trach would be medically ineffective and will not be offered in this case by PCCM.  CARDIOVASCULAR  Lines: PICC 8/5>>> TTE:  Reportedly preserved LV function without overt valvular abnormalities.  A: Shock (septic vs cardiogenic). At riak endocarditis, s/p endovascular infection.  Congestive heart failure. P: - Continue Levophed. - If increased vasopressor requirements consider Vasopressin / stress steroids but actually improving right now. - Change TLC to PICC line today.  RENAL  A:  Improving renal function.  Uremia. P:   Trend BMP  HEMATOLOGY A:  Stable anemia. P:   Trend CBC  ENDOCRINE  A:  Hyperglycemia. P: SSI/CBG  NEURO  A: Acute encephalopathy (septic, metabolic, uremic), actually following come command. P: Head CT only if mental status deteriorates again. Fentanyl for synchrony.  ID A:  Likely VAP. Endocarditis?  Endovascular  graft infection? P: Zosyn / Vanco / Cipro TEE pending - vegetation?  BEST PRACTICE / DISPOSITION Level of Care:  LTACH Primary Service:  Hospitalists Consultants:  PCCM Code Status:  Full Diet:  NPO DVT Px:  Heparin GI Px:  Protonix Social / Family:  DF had an extensive discussion with family regarding goals of care and expressed that it is [place] that patient would be liberated from vent. Comfort measures were suggested.  Awaiting response.  The patient is critically ill with multiple organ systems failure and requires high complexity decision making for assessment and support, frequent evaluation and titration of  therapies, application of advanced monitoring technologies and extensive interpretation of multiple databases. Critical Care Time devoted to patient care services described in this note is 30 minutes.  Koren Bound, M.D. Pulmonary and Critical Care Medicine [facility] Pager: [phone]  02/25/2012, 12:20 PM

## 2012-02-26 LAB — CBC
HCT: 32 % — ABNORMAL LOW (ref 39.0–52.0)
MCH: 27.8 pg (ref 26.0–34.0)
MCHC: 30 g/dL (ref 30.0–36.0)
MCV: 92.8 fL (ref 78.0–100.0)
Platelets: 347 10*3/uL (ref 150–400)
RDW: 17.3 % — ABNORMAL HIGH (ref 11.5–15.5)

## 2012-02-26 LAB — PROCALCITONIN: Procalcitonin: 0.7 ng/mL

## 2012-02-26 LAB — COMPREHENSIVE METABOLIC PANEL
Albumin: 1.6 g/dL — ABNORMAL LOW (ref 3.5–5.2)
BUN: 53 mg/dL — ABNORMAL HIGH (ref 6–23)
Calcium: 7.6 mg/dL — ABNORMAL LOW (ref 8.4–10.5)
Creatinine, Ser: 1.05 mg/dL (ref 0.50–1.35)
Total Bilirubin: 1.3 mg/dL — ABNORMAL HIGH (ref 0.3–1.2)
Total Protein: 4.7 g/dL — ABNORMAL LOW (ref 6.0–8.3)

## 2012-02-26 NOTE — Progress Notes (Signed)
Name: Gary [name] MRN: [id number] DOB: 10-19-1940    LOS: 5  Referring Provider:  [name] Reason for Referral:  Acute resp failure  PULMONARY / CRITICAL CARE MEDICINE   Brief patient description:  71 yo with COPD on home oxygen and CHF, s/p endovascular repair AAA with muliple complications  Events Since Admission: 8/1  Acute hypoxemic respiratory failure, rising PEEP, Dopa 20 mcg on arrival, CVP 4 8/2  Remains in shock, PEEP 10, urine output ok 8/4  Fluids KVO's, wound culture grown Pseudomonas, Cipro started for double coverage, Dopamine off - Levophed started, Precedex off - fentanyl started 8/6- remain son pressors lower dose, peep 8\  Current Status: Mechanically ventilated on vasopressors continued, pressors reduced  Vital Signs:  Reviewed  Physical Examination: General:  Mechanically ventilated, synchronous Neuro:  Nonverbal, opens eyes spontaneously, does not follow commands HEENT:  ETT Neck:  Increased JVD Cardiovascular:  RRR, tachycardiac distant Lungs: scattered rhonchi Abdomen:  Soft, obese, decreased breath sounds Skin:  No rash  Labs:  Lab 02/26/12 0655 02/25/12 0509 02/24/12 0620 02/23/12 0554 02/22/12 0600 03/01/12 1944  HGB 9.6* 10.2* 10.4* -- -- --  HCT 32.0* 32.5* 34.0* -- -- --  WBC 18.5* 22.9* 27.7* -- -- --  PLT 347 389 314 -- -- --  NA 143 -- 138 137 137 134*  K 3.7 -- 3.9 -- -- --  CL 106 -- 99 97 95* 91*  CO2 28 -- 31 27 37* 37*  GLUCOSE 570* -- 451* 376* 164* 325*  BUN 53* -- 68* 83* 93* 85*  CREATININE 1.05 -- 1.10 1.42* 1.49* 1.25  CALCIUM 7.6* -- 7.1* 7.1* 7.4* 7.9*  MG -- -- -- 4.9* 4.9* 4.8*  PHOS -- -- -- -- -- --  AST 53* -- -- -- -- 166*  ALT 74* -- -- -- -- 211*  ALKPHOS 136* -- -- -- -- 281*  BILITOT 1.3* -- -- -- -- 2.1*  PROT 4.7* -- -- -- -- 5.6*  ALBUMIN 1.6* -- -- -- -- 2.0*  INR -- -- -- -- -- 1.48  APTT -- -- -- -- -- 26  INR -- -- -- -- -- 1.48  LATICACIDVEN -- -- -- -- -- --  TROPONINI -- -- -- -- -- --      Lab 02/22/12 0417 2012/03/01 1448  PHART 7.389 7.359  PCO2ART 60.5* 68.1*  PO2ART 75.2* 76.2*  HCO3 34.9* 37.4*  TCO2 36.6 39.5  O2SAT 94.6 95.7   ASSESSMENT AND PLAN  PULMONARY  CXR:  8/6- picc malplaced ( per prim), bilat diffuse infuiltrates, pacer, fluid rt fissure ETT:  7/23 >>>  A:  Acute respiratory failure post AAA endovascualr repair.  GOLD stage IV COPD, no evidence of exacerbation.  Pulmonary edema vs pneumonia vs ARDS. P:  Remains with peep needs to 8  Full ventilatory support. pcxr reviewed, follow volume status Fluids KVO'd, consider diuresis when off vasopressors, may need to consider earlier if O2 needs worsen on vent indicating worsening pulm edema Trach would be medically ineffective and futile Should consider extubation at 14 days in best hopes for survival Maintain current MV  CARDIOVASCULAR  Lines: PICC 8/5>>> TTE:  Reportedly preserved LV function without overt valvular abnormalities.  A: Shock (septic vs cardiogenic). 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  Congestive heart failure. P: - Continue Levophed to MAP goals - If remains in need levo by am would add vaso for theoretical vaso deficiency  - Change TLC to PICC line today, done  RENAL  A:  Improving renal function.  Uremia. P:   Trend BMP May need lasix to neg balance if O2 needs worsen regardless of BP  HEMATOLOGY A:  Stable anemia. P:   Trend hct as dilution likely Wbc down  ENDOCRINE  A:  Hyperglycemia. P: SSI/CBG  NEURO  A: Acute encephalopathy (septic, metabolic, uremic), actually following some command. P: Fentanyl for synchrony.  ID A:  VAP from [place] upon arrival here, r./o line infection, bacteremia P: Per ID Levo for steno VAP, likely needs min 14 days vanc for line? Hope can get off pressors once line out  BEST PRACTICE / DISPOSITION Level of Care:  LTACH Primary Service:  Hospitalists Consultants:  PCCM Code Status:  DNR Diet:   NPO DVT Px:  Heparin GI Px:  Protonix Social / Family: DNR established  The patient is critically ill with multiple organ systems failure and requires high complexity decision making for assessment and support, frequent evaluation and titration of therapies, application of advanced monitoring technologies and extensive interpretation of multiple databases. Critical Care Time devoted to patient care services described in this note is 30 minutes.  Nelda Bucks., M.D. Pulmonary and Critical Care Medicine [facility] Pager: [phone]  02/26/2012, 4:50 PM

## 2012-02-26 NOTE — Progress Notes (Signed)
[facility]    Date of Admission:  03/19/2012   Total days of antibiotics 6        Levaquin 7/30-7/31, 8/5-current        Imipenem 5 days - DC        Vanc Day 3        Flagyl Day 3        Cipro 8/4-8/5 - DC          Active Problems:  Acute respiratory failure with hypoxia  Septic shock  COPD (chronic obstructive pulmonary disease)  ARDS (adult respiratory distress syndrome)  Subjective: Patient did not have any overnight episodes. Responds to verbal commands.   Objective: Vitals: 99.55F, 114/68, HR 107, RR 24, O2 sat 96% General: arousable, responds to verbal commands  HEENT: thick neck,  Cardiac: RRR, no rubs, murmurs or gallops  Pulm: course breath sounds b/l, rhonchi heard Abd: tender, positive bowel sounds  Skin: multiple echymoses on b/l upper extremities, drainage from bilateral groin sites from endovascular access  Neuro: nonfocal  Lab Results Lab Results  Component Value Date   WBC 18.5* 02/26/2012   HGB 9.6* 02/26/2012   HCT 32.0* 02/26/2012   MCV 92.8 02/26/2012   PLT 347 02/26/2012    Lab Results  Component Value Date   CREATININE 1.05 02/26/2012   BUN 53* 02/26/2012   NA 143 02/26/2012   K 3.7 02/26/2012   CL 106 02/26/2012   CO2 28 02/26/2012    Lab Results  Component Value Date   ALT 74* 02/26/2012   AST 53* 02/26/2012   ALKPHOS 136* 02/26/2012   BILITOT 1.3* 02/26/2012      Microbiology: Recent Results (from the past 240 hour(s))  CULTURE, BLOOD (ROUTINE X 2)     Status: Normal (Preliminary result)   Collection Time   02/29/2012  7:10 PM      Component Value Range Status Comment   Specimen Description BLOOD RIGHT FOOT   Final    Special Requests BOTTLES DRAWN AEROBIC ONLY 7CC   Final    Culture  Setup Time 02/22/2012 02:15   Final    Culture     Final    Value:        BLOOD CULTURE RECEIVED NO GROWTH TO DATE CULTURE WILL BE HELD FOR 5 DAYS BEFORE ISSUING A FINAL NEGATIVE REPORT   Report Status PENDING   Incomplete   CULTURE, BLOOD (ROUTINE X  2)     Status: Normal (Preliminary result)   Collection Time   03/05/2012  7:19 PM      Component Value Range Status Comment   Specimen Description BLOOD RIGHT ANKLE   Final    Special Requests     Final    Value: BOTTLES DRAWN AEROBIC AND ANAEROBIC BLUE 7CC RED 5CC   Culture  Setup Time 02/22/2012 02:15   Final    Culture     Final    Value:        BLOOD CULTURE RECEIVED NO GROWTH TO DATE CULTURE WILL BE HELD FOR 5 DAYS BEFORE ISSUING A FINAL NEGATIVE REPORT   Report Status PENDING   Incomplete   CULTURE, RESPIRATORY     Status: Normal   Collection Time   03/17/2012  9:40 PM      Component Value Range Status Comment   Specimen Description TRACHEAL ASPIRATE   Final    Special Requests NONE   Final    Gram Stain  Final    Value: FEW WBC PRESENT,BOTH PMN AND MONONUCLEAR     NO SQUAMOUS EPITHELIAL CELLS SEEN     NO ORGANISMS SEEN   Culture FEW STENOTROPHOMONAS MALTOPHILIA   Final    Report Status 02/24/2012 FINAL   Final    Organism ID, Bacteria STENOTROPHOMONAS MALTOPHILIA   Final   CULTURE, BLOOD (ROUTINE X 2)     Status: Normal (Preliminary result)   Collection Time   2012-03-17 11:00 PM      Component Value Range Status Comment   Specimen Description BLOOD CENTRAL LINE   Final    Special Requests BOTTLES DRAWN AEROBIC AND ANAEROBIC 10CC RIGHT IJ   Final    Culture  Setup Time 02/22/2012 03:11   Final    Culture     Final    Value:        BLOOD CULTURE RECEIVED NO GROWTH TO DATE CULTURE WILL BE HELD FOR 5 DAYS BEFORE ISSUING A FINAL NEGATIVE REPORT   Report Status PENDING   Incomplete   WOUND CULTURE     Status: Normal   Collection Time   02/22/12  1:14 AM      Component Value Range Status Comment   Specimen Description WOUND LEFT GROIN   Final    Special Requests NONE   Final    Gram Stain     Final    Value: NO WBC SEEN     FEW SQUAMOUS EPITHELIAL CELLS PRESENT     NO ORGANISMS SEEN   Culture FEW PSEUDOMONAS AERUGINOSA   Final    Report Status 02/25/2012 FINAL   Final    Organism  ID, Bacteria PSEUDOMONAS AERUGINOSA   Final   WOUND CULTURE     Status: Normal   Collection Time   02/22/12  2:13 AM      Component Value Range Status Comment   Specimen Description WOUND RIGHT GROIN   Final    Special Requests NONE   Final    Gram Stain     Final    Value: NO WBC SEEN     FEW SQUAMOUS EPITHELIAL CELLS PRESENT     NO ORGANISMS SEEN   Culture FEW PSEUDOMONAS AERUGINOSA   Final    Report Status 02/25/2012 FINAL   Final    Organism ID, Bacteria PSEUDOMONAS AERUGINOSA   Final   CULTURE, BLOOD (SINGLE)     Status: Normal (Preliminary result)   Collection Time   02/22/12  9:20 AM      Component Value Range Status Comment   Specimen Description BLOOD   Final    Special Requests     Final    Value: BOTTLES DRAWN AEROBIC AND ANAEROBIC 10CC WHITE PORT CVP   Culture  Setup Time 02/22/2012 16:52   Final    Culture     Final    Value:        BLOOD CULTURE RECEIVED NO GROWTH TO DATE CULTURE WILL BE HELD FOR 5 DAYS BEFORE ISSUING A FINAL NEGATIVE REPORT   Report Status PENDING   Incomplete   CULTURE, BLOOD (ROUTINE X 2)     Status: Normal (Preliminary result)   Collection Time   02/25/12  1:35 PM      Component Value Range Status Comment   Specimen Description BLOOD RIGHT HAND   Final    Special Requests BOTTLES DRAWN AEROBIC AND ANAEROBIC 10CC   Final    Culture  Setup Time 02/25/2012 19:31   Final    Culture  Final    Value:        BLOOD CULTURE RECEIVED NO GROWTH TO DATE CULTURE WILL BE HELD FOR 5 DAYS BEFORE ISSUING A FINAL NEGATIVE REPORT   Report Status PENDING   Incomplete     Studies/Results: Dg Chest Port 1 View  02/25/2012  *RADIOLOGY REPORT*  Clinical Data: PICC line placement.  PORTABLE CHEST - 1 VIEW  Comparison: 02/25/2012.  Findings: The endotracheal tube, NG tube and right IJ catheters are stable.  The pacer wires are stable.  There is a left-sided PICC line with its tip overlying the left lung apex.  I suspect this is in a branch of the subclavian vein.   Stable  diffuse airspace process and probable left effusion.  IMPRESSION:  Left-sided PICC line tip appears to be in a branch of the left subclavian vein.  Recommend repositioning.  Original Report Authenticated By: P. Loralie Champagne, M.D.   Dg Chest Port 1 View  02/25/2012  *RADIOLOGY REPORT*  Clinical Data: Intubation, ileus, respiratory failure  PORTABLE CHEST - 1 VIEW  Comparison: Chest radiograph 02/23/2012  Findings: Patient rotated rightward.  Endotracheal tube, NG tube, and right central venous line unchanged.  There are decreased lung volumes compared to prior.  There is new diffuse bilateral air space disease which is not improved.  Bibasilar atelectasis.  IMPRESSION:  1.  Stable support apparatus. 2.  Decreased lung volumes. 3.  Bilateral air space disease suggesting pulmonary edema is not improved.  Original Report Authenticated By: Genevive Bi, M.D.    Assessment: 71 yo male with a PMH of DM, HTN, chronic A fib, end stage COPD, sleep apnea on nightly BiPAP, presented to his doctor's office with intractable nausea, vomiting, weight loss, near syncope, poor appetite. He was found to have increased LFTs and went to the ED. 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 AAA was repaired on 7/25, was found to have respiratory failure and hypotensive and had to be intubated after the procedure. Pt had a high FiO2 requirement and was not responsive to steroids and levaquine and could not be weaned off of ventilator.  Pt continues to have fevers, hypotension, and can not be weaned off ventilation. We are concerned about possible bacteremia vs. AAA repair infection vs. Ventilation associated pneumonia. Source of infection is unknown, as is any specific bacterial culprit for the patient's continued poor health status.  Plan: 1. Persistent Fevers and hypotension: Differential includes AAA endovascular infection, intrabdominal abscess, VAP, drug fever, wound infection possible, but less likely. No diarrhea  to suggest C. Diff infection.  -waiting on: repeat blood cx X2, BAL culture, cath tip culture  -US abdomen to hopefully assess AAA site  -CT Angiogram to examine AAA site and look at abdomen would be ideal but not feasable in patient's current state -narrowed Abx to levaquin (will have activity against Stenotrophomonas), vancomycin and flagyl  -DC imipenem   2. Possible ventilator associated pneumonia: S maltophila may reflect colonization and antibiotic pressure for R organisms. Will narrow as above  -Repeat BAL (non bronch)   3. 2 wounds with Pseudomonas in left and right groin area -likely a colonizer   4. S/p AAA repair  -questionable infected AAA repair.  -see above   Lenox Ahr [facility] 02/26/2012, 9:50 AM   This is a Psychologist, occupational Note. The care of the patient was discussed with Dr. Daiva Eves and the assessment and plan was formulated with  their assistance. Please see their note for official documentation of the patient encounter.

## 2012-02-26 NOTE — Progress Notes (Signed)
Infectious Diseases Attending Note Date: 02/26/2012  Patient name: Gary [name]  Medical record number: [id number]  Date of birth: 06-May-1941    This patient has been seen and discussed with the house staff. Please see their note for complete details. I concur with their findings with the following additions/corrections:  Complicated 71 yo gentleman who has had sepsis physiology after AAA endovascular repair, now with S. Matophila in culture from lungs.  IF we can get him OFF pressors would get CT Angiogram of AAA to assess for infection of aneursym and assess for intrabdominal infection. He does continue to spike fevers irrespective of antibiotics he is on.  For now continue broad spectrum vancomycin, levaquin and flagyl though I do not like giving protracted broad spectrum abx without clear cut source. We will reculture his sputum as well  Acey Lav 02/26/2012, 4:54 PM

## 2012-02-27 ENCOUNTER — Other Ambulatory Visit (HOSPITAL_COMMUNITY): Payer: Self-pay

## 2012-02-27 LAB — BASIC METABOLIC PANEL
BUN: 62 mg/dL — ABNORMAL HIGH (ref 6–23)
Chloride: 110 mEq/L (ref 96–112)
GFR calc Af Amer: 65 mL/min — ABNORMAL LOW (ref >90)
Potassium: 4.2 mEq/L (ref 3.5–5.1)

## 2012-02-27 LAB — CBC WITH DIFFERENTIAL/PLATELET
Basophils Absolute: 0 10*3/uL (ref 0.0–0.1)
Basophils Relative: 0 % (ref 0–1)
Eosinophils Absolute: 0 10*3/uL (ref 0.0–0.7)
Eosinophils Relative: 0 % (ref 0–5)
HCT: 31.3 % — ABNORMAL LOW (ref 39.0–52.0)
Hemoglobin: 9.3 g/dL — ABNORMAL LOW (ref 13.0–17.0)
Lymphocytes Relative: 4 % — ABNORMAL LOW (ref 12–46)
Lymphs Abs: 0.9 10*3/uL (ref 0.7–4.0)
MCH: 28.2 pg (ref 26.0–34.0)
MCHC: 29.7 g/dL — ABNORMAL LOW (ref 30.0–36.0)
MCV: 94.8 fL (ref 78.0–100.0)
Monocytes Absolute: 1.5 10*3/uL — ABNORMAL HIGH (ref 0.1–1.0)
Monocytes Relative: 6 % (ref 3–12)
Neutro Abs: 21.8 10*3/uL — ABNORMAL HIGH (ref 1.7–7.7)
Neutrophils Relative %: 90 % — ABNORMAL HIGH (ref 43–77)
Platelets: 342 10*3/uL (ref 150–400)
RBC: 3.3 MIL/uL — ABNORMAL LOW (ref 4.22–5.81)
RDW: 17.5 % — ABNORMAL HIGH (ref 11.5–15.5)
WBC: 24.2 10*3/uL — ABNORMAL HIGH (ref 4.0–10.5)

## 2012-02-27 LAB — MAGNESIUM: Magnesium: 3.2 mg/dL — ABNORMAL HIGH (ref 1.5–2.5)

## 2012-02-27 NOTE — Progress Notes (Signed)
[facility]    Date of Admission:  03/01/2012   Total days of antibiotics 7        Levaquin 7/30-7/31, 8/5-current         Imipenem 5 days - DC         Vanc Day 4         Flagyl Day 4   Active Problems:  Acute respiratory failure with hypoxia  Septic shock  COPD (chronic obstructive pulmonary disease)  ARDS (adult respiratory distress syndrome)  Subjective: Patient is less responsive than previous days. Patient is not verbal, but nurse reports no overnight events.  Objective: Vitals: 97.7 F, 96/45, HR 80, RR 14, 97% O2 sat General: arousable, responds minimally to verbal commands  HEENT: thick neck,  Cardiac: RRR, no rubs, murmurs or gallops  Pulm: course breath sounds b/l, rhonchi heard  Abd: tender, positive bowel sounds  Skin: multiple echymoses on b/l upper extremities, drainage from bilateral groin sites from endovascular access  Neuro: nonfocal   Lab Results Lab Results  Component Value Date   WBC 24.2* 02/27/2012   HGB 9.3* 02/27/2012   HCT 31.3* 02/27/2012   MCV 94.8 02/27/2012   PLT 342 02/27/2012    Lab Results  Component Value Date   CREATININE 1.24 02/27/2012   BUN 62* 02/27/2012   NA 144 02/27/2012   K 4.2 02/27/2012   CL 110 02/27/2012   CO2 30 02/27/2012    Lab Results  Component Value Date   ALT 74* 02/26/2012   AST 53* 02/26/2012   ALKPHOS 136* 02/26/2012   BILITOT 1.3* 02/26/2012      Microbiology: Recent Results (from the past 240 hour(s))  CULTURE, BLOOD (ROUTINE X 2)     Status: Normal (Preliminary result)   Collection Time   03/10/2012  7:10 PM      Component Value Range Status Comment   Specimen Description BLOOD RIGHT FOOT   Final    Special Requests BOTTLES DRAWN AEROBIC ONLY 7CC   Final    Culture  Setup Time 02/22/2012 02:15   Final    Culture     Final    Value:        BLOOD CULTURE RECEIVED NO GROWTH TO DATE CULTURE WILL BE HELD FOR 5 DAYS BEFORE ISSUING A FINAL NEGATIVE REPORT   Report Status PENDING   Incomplete   CULTURE,  BLOOD (ROUTINE X 2)     Status: Normal (Preliminary result)   Collection Time   03/04/2012  7:19 PM      Component Value Range Status Comment   Specimen Description BLOOD RIGHT ANKLE   Final    Special Requests     Final    Value: BOTTLES DRAWN AEROBIC AND ANAEROBIC BLUE 7CC RED 5CC   Culture  Setup Time 02/22/2012 02:15   Final    Culture     Final    Value:        BLOOD CULTURE RECEIVED NO GROWTH TO DATE CULTURE WILL BE HELD FOR 5 DAYS BEFORE ISSUING A FINAL NEGATIVE REPORT   Report Status PENDING   Incomplete   CULTURE, RESPIRATORY     Status: Normal   Collection Time   03/01/2012  9:40 PM      Component Value Range Status Comment   Specimen Description TRACHEAL ASPIRATE   Final    Special Requests NONE   Final    Gram Stain     Final    Value: FEW  WBC PRESENT,BOTH PMN AND MONONUCLEAR     NO SQUAMOUS EPITHELIAL CELLS SEEN     NO ORGANISMS SEEN   Culture FEW STENOTROPHOMONAS MALTOPHILIA   Final    Report Status 02/24/2012 FINAL   Final    Organism ID, Bacteria STENOTROPHOMONAS MALTOPHILIA   Final   CULTURE, BLOOD (ROUTINE X 2)     Status: Normal (Preliminary result)   Collection Time   2012-03-14 11:00 PM      Component Value Range Status Comment   Specimen Description BLOOD CENTRAL LINE   Final    Special Requests BOTTLES DRAWN AEROBIC AND ANAEROBIC 10CC RIGHT IJ   Final    Culture  Setup Time 02/22/2012 03:11   Final    Culture     Final    Value:        BLOOD CULTURE RECEIVED NO GROWTH TO DATE CULTURE WILL BE HELD FOR 5 DAYS BEFORE ISSUING A FINAL NEGATIVE REPORT   Report Status PENDING   Incomplete   WOUND CULTURE     Status: Normal   Collection Time   02/22/12  1:14 AM      Component Value Range Status Comment   Specimen Description WOUND LEFT GROIN   Final    Special Requests NONE   Final    Gram Stain     Final    Value: NO WBC SEEN     FEW SQUAMOUS EPITHELIAL CELLS PRESENT     NO ORGANISMS SEEN   Culture FEW PSEUDOMONAS AERUGINOSA   Final    Report Status 02/25/2012 FINAL    Final    Organism ID, Bacteria PSEUDOMONAS AERUGINOSA   Final   WOUND CULTURE     Status: Normal   Collection Time   02/22/12  2:13 AM      Component Value Range Status Comment   Specimen Description WOUND RIGHT GROIN   Final    Special Requests NONE   Final    Gram Stain     Final    Value: NO WBC SEEN     FEW SQUAMOUS EPITHELIAL CELLS PRESENT     NO ORGANISMS SEEN   Culture FEW PSEUDOMONAS AERUGINOSA   Final    Report Status 02/25/2012 FINAL   Final    Organism ID, Bacteria PSEUDOMONAS AERUGINOSA   Final   CULTURE, BLOOD (SINGLE)     Status: Normal (Preliminary result)   Collection Time   02/22/12  9:20 AM      Component Value Range Status Comment   Specimen Description BLOOD   Final    Special Requests     Final    Value: BOTTLES DRAWN AEROBIC AND ANAEROBIC 10CC WHITE PORT CVP   Culture  Setup Time 02/22/2012 16:52   Final    Culture     Final    Value:        BLOOD CULTURE RECEIVED NO GROWTH TO DATE CULTURE WILL BE HELD FOR 5 DAYS BEFORE ISSUING A FINAL NEGATIVE REPORT   Report Status PENDING   Incomplete   CULTURE, BLOOD (ROUTINE X 2)     Status: Normal (Preliminary result)   Collection Time   02/25/12  1:35 PM      Component Value Range Status Comment   Specimen Description BLOOD RIGHT HAND   Final    Special Requests BOTTLES DRAWN AEROBIC AND ANAEROBIC 10CC   Final    Culture  Setup Time 02/25/2012 19:31   Final    Culture     Final  Value:        BLOOD CULTURE RECEIVED NO GROWTH TO DATE CULTURE WILL BE HELD FOR 5 DAYS BEFORE ISSUING A FINAL NEGATIVE REPORT   Report Status PENDING   Incomplete   CULTURE, BLOOD (ROUTINE X 2)     Status: Normal (Preliminary result)   Collection Time   02/26/12  3:00 AM      Component Value Range Status Comment   Specimen Description BLOOD RIGHT ARM   Final    Special Requests BOTTLES DRAWN AEROBIC AND ANAEROBIC 10CC EACH   Final    Culture  Setup Time 02/26/2012 08:52   Final    Culture     Final    Value:        BLOOD CULTURE RECEIVED NO  GROWTH TO DATE CULTURE WILL BE HELD FOR 5 DAYS BEFORE ISSUING A FINAL NEGATIVE REPORT   Report Status PENDING   Incomplete   CATH TIP CULTURE     Status: Normal (Preliminary result)   Collection Time   02/26/12  3:15 AM      Component Value Range Status Comment   Specimen Description CATH TIP   Final    Special Requests RIGHT IJ   Final    Culture NO GROWTH 1 DAY   Final    Report Status PENDING   Incomplete   CULTURE, RESPIRATORY     Status: Normal (Preliminary result)   Collection Time   02/26/12  5:10 PM      Component Value Range Status Comment   Specimen Description TRACHEAL ASPIRATE   Final    Special Requests NONE   Final    Gram Stain     Final    Value: FEW WBC PRESENT,BOTH PMN AND MONONUCLEAR     FEW SQUAMOUS EPITHELIAL CELLS PRESENT     NO ORGANISMS SEEN   Culture PENDING   Incomplete    Report Status PENDING   Incomplete     Studies/Results: Dg Chest Port 1 View  02/25/2012  *RADIOLOGY REPORT*  Clinical Data: PICC line placement.  PORTABLE CHEST - 1 VIEW  Comparison: 02/25/2012.  Findings: The endotracheal tube, NG tube and right IJ catheters are stable.  The pacer wires are stable.  There is a left-sided PICC line with its tip overlying the left lung apex.  I suspect this is in a branch of the subclavian vein.   Stable diffuse airspace process and probable left effusion.  IMPRESSION:  Left-sided PICC line tip appears to be in a branch of the left subclavian vein.  Recommend repositioning.  Original Report Authenticated By: P. Loralie Champagne, M.D.    Assessment: 71 yo male with a PMH of DM, HTN, chronic A fib, end stage COPD, sleep apnea on nightly BiPAP, presented to his doctor's office with intractable nausea, vomiting, weight loss, near syncope, poor appetite. He was found to have increased LFTs and went to the ED. 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 AAA was repaired on 7/25, was found to have respiratory failure and hypotensive and had to be intubated after the  procedure. Pt had a high FiO2 requirement and was not responsive to steroids and levaquine and could not be weaned off of ventilator.  Pt continues to have fevers, hypotension, and can not be weaned off ventilation. We are concerned about possible bacteremia vs. AAA repair infection vs. Ventilation associated pneumonia. Source of infection is unknown, as is any specific bacterial culprit for the patient's continued poor health status.  WBC count increased to 24.2 today and BUN has increased from 53 to 62 today, with GFR of 56. Vanc trough <5.  Plan: 1. Persistent Fevers and hypotension: Differential includes AAA endovascular infection, intrabdominal abscess, VAP, drug fever, wound infection possible, but less likely.  -waiting on: repeat blood cx X2 (negative thus far), cath tip culture (negative x 1 day) -[facility]  -US abdomen to hopefully assess AAA site  -CT Angiogram to examine AAA site and look at abdomen would be ideal but not feasable in patient's current state  -continue Abx: levaquin, vancomycin and flagyl   2. Possible ventilator associated pneumonia: S maltophila may reflect colonization and antibiotic pressure for R organisms. Will narrow as above  -CXR to observe any change in lungs - sputum culture  3. 2 wounds with Pseudomonas in left and right groin area  -likely a colonizer   4. S/p AAA repair  -questionable infected AAA repair.  -US abdomen since CT is not possible with patient's current status. [place] may show fluid collection/abscess to aid in source of infection.   Lenox Ahr for Infectious [facility] 02/27/2012, 9:20 AM  This is a Psychologist, occupational Note. The care of the patient was discussed with Dr. Daiva Eves and the assessment and plan was formulated with their assistance. Please see their note for official documentation of the patient encounter.

## 2012-02-27 NOTE — Progress Notes (Signed)
Infectious Diseases Attending Note Date: 02/27/2012  Patient name: QUAMERE MUSSELL  Medical record number: [id number]  Date of birth: 1941-01-19    This patient has been seen and discussed with the house staff. Please see their note for complete details. I concur with their findings with the following additions/corrections:   Patient now with  YEAST in 1/2 blood cultures from the 5th.  Agree with starting mycafungin.  He will need PICC line now removed and were he to survive would also need ophtho exam to rule out endopthalmitis  He is with increasing pressor requirements now on micafungin, levaquin, flagyl and vancomycin.  I would see how he does with addition of micafungin and then try to narrow other antibiotics as able. I DO worry that his aneurysm site may be infected. Prognosis remains grim.  Acey Lav 02/27/2012, 6:56 PM

## 2012-02-27 NOTE — Progress Notes (Signed)
Name: Gary [name] MRN: [id number] DOB: 1941/04/21    LOS: 6  Referring Provider:  [name] Reason for Referral:  Acute resp failure  PULMONARY / CRITICAL CARE MEDICINE   Brief patient description:  71 yo with COPD on home oxygen and CHF, s/p endovascular repair AAA with muliple complications now with VDRF tx to [facility] with oral ETT.   Events Since Admission: 8/1  Acute hypoxemic respiratory failure, rising PEEP, Dopa 20 mcg on arrival, CVP 4 8/2  Remains in shock, PEEP 10, urine output ok 8/4  Fluids KVO's, wound culture grown Pseudomonas, Cipro started for double coverage, Dopamine off - Levophed started, Precedex off - fentanyl started 8/6- remain son pressors lower dose, peep 8  Current Status:  Increasing pressor needs.   Vital Signs:  Reviewed  Physical Examination: General:  Mechanically ventilated, synchronous Neuro:  Nonverbal, opens eyes spontaneously, does not follow commands HEENT:  ETT Neck:  Increased JVD Cardiovascular:  RRR, tachycardiac distant Lungs: scattered rhonchi Abdomen:  Soft, obese, decreased breath sounds Skin:  No rash  Labs:  Lab 02/27/12 0529 02/26/12 0655 02/25/12 0509 02/24/12 0620 02/23/12 0554 02/22/12 0600 03/12/2012 1944  HGB 9.3* 9.6* 10.2* -- -- -- --  HCT 31.3* 32.0* 32.5* -- -- -- --  WBC 24.2* 18.5* 22.9* -- -- -- --  PLT 342 347 389 -- -- -- --  NA 144 143 -- 138 137 137 --  K 4.2 3.7 -- -- -- -- --  CL 110 106 -- 99 97 95* --  CO2 30 28 -- 31 27 37* --  GLUCOSE 437* 570* -- 451* 376* 164* --  BUN 62* 53* -- 68* 83* 93* --  CREATININE 1.24 1.05 -- 1.10 1.42* 1.49* --  CALCIUM 7.8* 7.6* -- 7.1* 7.1* 7.4* --  MG 3.2* -- -- -- 4.9* 4.9* 4.8*  PHOS -- -- -- -- -- -- --  AST -- 53* -- -- -- -- 166*  ALT -- 74* -- -- -- -- 211*  ALKPHOS -- 136* -- -- -- -- 281*  BILITOT -- 1.3* -- -- -- -- 2.1*  PROT -- 4.7* -- -- -- -- 5.6*  ALBUMIN -- 1.6* -- -- -- -- 2.0*  INR -- -- -- -- -- -- 1.48  APTT -- -- -- -- -- -- 26  INR  -- -- -- -- -- -- 1.48  LATICACIDVEN -- -- -- -- -- -- --  TROPONINI -- -- -- -- -- -- --    Lab 02/22/12 0417 03/15/2012 1448  PHART 7.389 7.359  PCO2ART 60.5* 68.1*  PO2ART 75.2* 76.2*  HCO3 34.9* 37.4*  TCO2 36.6 39.5  O2SAT 94.6 95.7   ASSESSMENT AND PLAN   CXR:  8/6- picc malplaced ( per prim), bilat diffuse infuiltrates, pacer, fluid rt fissure ETT:  7/23 >>>  A:  Acute respiratory failure post AAA endovascualr repair.  GOLD stage IV COPD, no evidence of exacerbation.  Pulmonary edema vs pneumonia vs ARDS. P:  Remains with peep needs to 8  Full ventilatory support. Repeat PCXR Gentle diuresis with low dose lasix gtt per primary  Trach would be medically ineffective and futile as would dialysis  Should consider extubation at 14 days in best hopes for survival Maintain current MV **Need to cont extensive goals of care discussions with family.  Currently DNR.  Not candidate for trach or dialysis.  Cont to worsen clinically.  Would recommend transition to comfort care and withdrawal of vent support.    A: Shock (septic  vs cardiogenic). 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  Congestive heart failure. P: - Continue Levophed to MAP goals - If remains in need levo by am would add vaso for theoretical vaso deficiency  - Change TLC to PICC line today, done   A:  Improving renal function.  Uremia. P:   Trend BMP May need lasix to neg balance if O2 needs worsen regardless of BP    A:  VAP from [place] upon arrival here, r./o line infection, bacteremia P: Per ID Levo for steno VAP, likely needs min 14 days vanc for line? ID following   WHITEHEART,KATHRYN, NP 02/27/2012  12:27 PM Pager: [phone] or [phone]  *Care during the described time interval was provided by me and/or other providers on the critical care team. I have reviewed this patient's available data, including medical history, events of note, physical examination and test results as  part of my evaluation.   Add zaroxolyn and albumin to encourage diureses.  Add mycamine for anti-fungal coverage.  Discussed at length with primary.  CC time 35 min.  Patient seen and examined, agree with above note.  I dictated the care and orders written for this patient under my direction.  Koren Bound, M.D. [phone]

## 2012-02-28 ENCOUNTER — Other Ambulatory Visit (HOSPITAL_COMMUNITY): Payer: Self-pay

## 2012-02-28 DIAGNOSIS — R509 Fever, unspecified: Secondary | ICD-10-CM

## 2012-02-28 DIAGNOSIS — A419 Sepsis, unspecified organism: Secondary | ICD-10-CM

## 2012-02-28 DIAGNOSIS — R6521 Severe sepsis with septic shock: Secondary | ICD-10-CM

## 2012-02-28 DIAGNOSIS — R6520 Severe sepsis without septic shock: Secondary | ICD-10-CM

## 2012-02-28 LAB — CBC WITH DIFFERENTIAL/PLATELET
Basophils Absolute: 0 10*3/uL (ref 0.0–0.1)
Eosinophils Relative: 0 % (ref 0–5)
Lymphs Abs: 0.8 10*3/uL (ref 0.7–4.0)
Monocytes Relative: 7 % (ref 3–12)
Neutrophils Relative %: 90 % — ABNORMAL HIGH (ref 43–77)
Platelets: 340 10*3/uL (ref 150–400)
RBC: 3.41 MIL/uL — ABNORMAL LOW (ref 4.22–5.81)
RDW: 17.8 % — ABNORMAL HIGH (ref 11.5–15.5)
WBC: 25.3 10*3/uL — ABNORMAL HIGH (ref 4.0–10.5)

## 2012-02-28 LAB — CULTURE, BLOOD (ROUTINE X 2)

## 2012-02-28 LAB — BASIC METABOLIC PANEL
GFR calc Af Amer: 60 mL/min — ABNORMAL LOW (ref >90)
GFR calc non Af Amer: 52 mL/min — ABNORMAL LOW (ref >90)
Glucose, Bld: 483 mg/dL — ABNORMAL HIGH (ref 70–99)
Potassium: 5.4 mEq/L — ABNORMAL HIGH (ref 3.5–5.1)
Sodium: 140 mEq/L (ref 135–145)

## 2012-02-28 LAB — CATH TIP CULTURE

## 2012-02-28 LAB — CULTURE, BLOOD (SINGLE)

## 2012-02-28 LAB — HEPATIC FUNCTION PANEL
AST: 36 U/L (ref 0–37)
Albumin: 1.7 g/dL — ABNORMAL LOW (ref 3.5–5.2)

## 2012-02-28 LAB — PROCALCITONIN: Procalcitonin: 0.55 ng/mL

## 2012-02-28 NOTE — Progress Notes (Signed)
[facility]    Date of Admission:  2012/03/05   Total days of antibiotics 8        Levaquin 7/30-7/31, 8/5-current         Vanc Day 5        Flagyl Day 5        Micafungin Day 2        Imipenem 5 days - DC Active Problems:  Acute respiratory failure with hypoxia  Septic shock  COPD (chronic obstructive pulmonary disease)  ARDS (adult respiratory distress syndrome)       Subjective: Patient was non responsive to verbal commands or pain.  Objective: Vitals: 97.4 F, 105/49, HR 90, RR 18, 02 Sat 92% on ventilation General: not arousable, responds minimally to verbal commands or pain HEENT: thick neck,  Cardiac: RRR, no rubs, murmurs or gallops  Pulm: course breath sounds b/l, rhonchi heard  Abd: distended, tender, minimal bowel sounds  Skin: edema on b/l upper extremities, drainage from bilateral groin sites from endovascular access  Neuro: nonfocal  Lab Results Lab Results  Component Value Date   WBC 24.2* 02/27/2012   HGB 9.3* 02/27/2012   HCT 31.3* 02/27/2012   MCV 94.8 02/27/2012   PLT 342 02/27/2012    Lab Results  Component Value Date   CREATININE 1.33 02/28/2012   BUN 81* 02/28/2012   NA 140 02/28/2012   K 5.4* 02/28/2012   CL 108 02/28/2012   CO2 25 02/28/2012    Lab Results  Component Value Date   ALT 51 02/28/2012   AST 36 02/28/2012   ALKPHOS 143* 02/28/2012   BILITOT 0.9 02/28/2012      Microbiology: Recent Results (from the past 240 hour(s))  CULTURE, BLOOD (ROUTINE X 2)     Status: Normal   Collection Time   2012/03/05  7:10 PM      Component Value Range Status Comment   Specimen Description BLOOD RIGHT FOOT   Final    Special Requests BOTTLES DRAWN AEROBIC ONLY [facility]   Final    Culture  Setup Time 02/22/2012 02:15   Final    Culture NO GROWTH 5 DAYS   Final    Report Status 02/28/2012 FINAL   Final   CULTURE, BLOOD (ROUTINE X 2)     Status: Normal   Collection Time   Mar 05, 2012  7:19 PM      Component Value Range Status Comment   Specimen  Description BLOOD RIGHT ANKLE   Final    Special Requests     Final    Value: BOTTLES DRAWN AEROBIC AND ANAEROBIC BLUE 7CC RED 5CC   Culture  Setup Time 02/22/2012 02:15   Final    Culture NO GROWTH 5 DAYS   Final    Report Status 02/28/2012 FINAL   Final   CULTURE, RESPIRATORY     Status: Normal   Collection Time   March 05, 2012  9:40 PM      Component Value Range Status Comment   Specimen Description TRACHEAL ASPIRATE   Final    Special Requests NONE   Final    Gram Stain     Final    Value: FEW WBC PRESENT,BOTH PMN AND MONONUCLEAR     NO SQUAMOUS EPITHELIAL CELLS SEEN     NO ORGANISMS SEEN   Culture FEW STENOTROPHOMONAS MALTOPHILIA   Final    Report Status 02/24/2012 FINAL   Final    Organism ID, Bacteria STENOTROPHOMONAS MALTOPHILIA   Final  CULTURE, BLOOD (ROUTINE X 2)     Status: Normal   Collection Time   03/16/2012 11:00 PM      Component Value Range Status Comment   Specimen Description BLOOD CENTRAL LINE   Final    Special Requests BOTTLES DRAWN AEROBIC AND ANAEROBIC 10CC RIGHT IJ   Final    Culture  Setup Time 02/22/2012 03:11   Final    Culture NO GROWTH 5 DAYS   Final    Report Status 02/28/2012 FINAL   Final   WOUND CULTURE     Status: Normal   Collection Time   02/22/12  1:14 AM      Component Value Range Status Comment   Specimen Description WOUND LEFT GROIN   Final    Special Requests NONE   Final    Gram Stain     Final    Value: NO WBC SEEN     FEW SQUAMOUS EPITHELIAL CELLS PRESENT     NO ORGANISMS SEEN   Culture FEW PSEUDOMONAS AERUGINOSA   Final    Report Status 02/25/2012 FINAL   Final    Organism ID, Bacteria PSEUDOMONAS AERUGINOSA   Final   WOUND CULTURE     Status: Normal   Collection Time   02/22/12  2:13 AM      Component Value Range Status Comment   Specimen Description WOUND RIGHT GROIN   Final    Special Requests NONE   Final    Gram Stain     Final    Value: NO WBC SEEN     FEW SQUAMOUS EPITHELIAL CELLS PRESENT     NO ORGANISMS SEEN   Culture FEW  PSEUDOMONAS AERUGINOSA   Final    Report Status 02/25/2012 FINAL   Final    Organism ID, Bacteria PSEUDOMONAS AERUGINOSA   Final   CULTURE, BLOOD (SINGLE)     Status: Normal   Collection Time   02/22/12  9:20 AM      Component Value Range Status Comment   Specimen Description BLOOD   Final    Special Requests     Final    Value: BOTTLES DRAWN AEROBIC AND ANAEROBIC 10CC WHITE PORT CVP   Culture  Setup Time 02/22/2012 16:52   Final    Culture NO GROWTH 5 DAYS   Final    Report Status 02/28/2012 FINAL   Final   CULTURE, BLOOD (ROUTINE X 2)     Status: Normal (Preliminary result)   Collection Time   02/25/12  1:35 PM      Component Value Range Status Comment   Specimen Description BLOOD RIGHT HAND   Final    Special Requests BOTTLES DRAWN AEROBIC AND ANAEROBIC 10CC   Final    Culture  Setup Time 02/25/2012 19:31   Final    Culture     Final    Value:        BLOOD CULTURE RECEIVED NO GROWTH TO DATE CULTURE WILL BE HELD FOR 5 DAYS BEFORE ISSUING A FINAL NEGATIVE REPORT   Report Status PENDING   Incomplete   CULTURE, BLOOD (ROUTINE X 2)     Status: Normal (Preliminary result)   Collection Time   02/26/12  3:00 AM      Component Value Range Status Comment   Specimen Description BLOOD RIGHT ARM   Final    Special Requests BOTTLES DRAWN AEROBIC AND ANAEROBIC 10CC EACH   Final    Culture  Setup Time 02/26/2012 08:52   Final  Culture     Final    Value: YEAST     Note: Gram Stain Report Called to,Read Back By and Verified With: Wynona Dove @1305  02/27/12 BY KRAWS   Report Status PENDING   Incomplete   CATH TIP CULTURE     Status: Normal   Collection Time   02/26/12  3:15 AM      Component Value Range Status Comment   Specimen Description CATH TIP   Final    Special Requests RIGHT IJ   Final    Culture NO GROWTH 2 DAYS   Final    Report Status 02/28/2012 FINAL   Final   CULTURE, RESPIRATORY     Status: Normal (Preliminary result)   Collection Time   02/26/12  5:10 PM      Component Value  Range Status Comment   Specimen Description TRACHEAL ASPIRATE   Final    Special Requests NONE   Final    Gram Stain     Final    Value: FEW WBC PRESENT,BOTH PMN AND MONONUCLEAR     FEW SQUAMOUS EPITHELIAL CELLS PRESENT     NO ORGANISMS SEEN   Culture PENDING   Incomplete    Report Status PENDING   Incomplete     Studies/Results: Dg Chest Port 1 View  02/28/2012  *RADIOLOGY REPORT*  Clinical Data: Abdominal pain, sepsis  PORTABLE CHEST - 1 VIEW  Comparison: 02/27/2012  Findings: Cardiomegaly again noted.  Dual lead cardiac pacemaker is unchanged in position.  Stable endotracheal tube position.  Stable right PICC line position.  There is worsening in aeration with central vascular congestion and perihilar interstitial prominence consistent with worsening pulmonary edema.  Probable small bilateral pleural effusion with bilateral basilar atelectasis or infiltrate. Stable NG tube position.  IMPRESSION:  Stable endotracheal tube position.  Stable right PICC line position.  There is worsening in aeration with central vascular congestion and perihilar interstitial prominence consistent with worsening pulmonary edema.  Probable small bilateral pleural effusion with bilateral basilar atelectasis or infiltrate.  Original Report Authenticated By: Natasha Mead, M.D.   Dg Chest Port 1 View  02/27/2012  *RADIOLOGY REPORT*  Clinical Data: PICC line placement, intubated  PORTABLE CHEST - 1 VIEW  Comparison: 02/25/2012  Findings: Right PICC line tip is at the level of the SVC. Endotracheal tube 6 cm above the carina.  NG tube remains in place. Left subclavian AICD evident.  Cardiomegaly noted with some improvement in diffuse edema pattern.  Residual basilar atelectasis noted.  No enlarging effusion or pneumothorax.  IMPRESSION: Right PICC line tip at the level of the SVC.  Improving CHF  Original Report Authenticated By: Judie Petit. Ruel Favors, M.D.   Dg Abd Portable 1v  02/28/2012  *RADIOLOGY REPORT*  Clinical Data: Ileus.   Abdominal pain.  PORTABLE ABDOMEN - 1 VIEW  Comparison: 02/23/2012.  Findings: Gas distended small and large bowel.  The cecum measures up to 11.3 cm.  Small bowel measures up to 5.5 cm.  This represents progressive gaseous distension of bowel when compared to the prior examination.  It is possible this is related to an ileus.  Distal obstructing lesion not excluded.  The possibility of free intraperitoneal air cannot be addressed on a supine view.  IMPRESSION: Progressive gas distended bowel as noted above.  This is a call report.  Original Report Authenticated By: Fuller Canada, M.D.    Assessment: 71 yo male with a PMH of DM, HTN, chronic A fib, end stage COPD, sleep apnea  on nightly BiPAP, presented to his doctor's office with intractable nausea, vomiting, weight loss, near syncope, poor appetite. He was found to have increased LFTs and went to the ED. 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 AAA was repaired on 7/25, was found to have respiratory failure and hypotensive and had to be intubated after the procedure. Pt had a high FiO2 requirement and was not responsive to steroids and levaquine and could not be weaned off of ventilator.  Pt continues to have fevers, hypotension, and can not be weaned off ventilation. We are concerned about possible bacteremia vs. AAA repair infection vs. Ventilation associated pneumonia. Source of infection is unknown, as is any specific bacterial culprit for the patient's continued poor health status.  Plan: 1. Persistent Fevers and hypotension: Differential includes AAA endovascular infection, intrabdominal abscess, VAP, drug fever, wound infection possible, but less likely.  -blood culture grew yeast, started micafungin yesterday (02/27/12) -CT Angiogram to examine AAA site not feasable  -continue levaquin, vancomycin and flagyl   2. Possible ventilator associated pneumonia: S maltophila may reflect colonization and antibiotic pressure for R  organisms. -CXR to observe any change in lungs - sputum culture   3. 2 wounds with Pseudomonas in left and right groin area  -likely a colonizer   4. S/p AAA repair  - questionable infected AAA repair - see above  Family has arranged a meeting with primary medical team to reassess patient's long term goals - for 2pm today.    Lenox Ahr for Infectious [facility] 02/28/2012, 10:08 AM   This is a Medical Student Note. The care of the patient was discussed with Dr. Daiva Eves and the assessment and plan was formulated with their assistance. Please see their note for official documentation of the patient encounter.     INFECTIOUS DISEASE ATTENDING ADDENDUM:     [facility]   Date: 02/28/2012  Patient name: ZAAHIR PICKNEY  Medical record number: [id number]  Date of birth: 08-13-1940    This patient has been seen and discussed with the house staff. Please see their note for complete details. I concur with their findings with the following additions/corrections:  Patient continues to deteriorate and on 10 levophed, vasopressin.  IF aggressive care continued to be pursued would consider adding back a carbapenem, in this case meropenem and stopping the flagyl, Would continue levaquin for now as well as micafungin.  Re the fungemia he will need ALL central lines dc'd when able to ensure clearance. Family apparently do not seem to fully grasp futility aggressive care here.    Acey Lav 02/28/2012, 5:22 PM

## 2012-02-28 NOTE — Progress Notes (Signed)
Name: Gary [name] MRN: [id number] DOB: [birth year]    LOS: 7  Referring Provider:  [name] Reason for Referral:  Acute resp failure  PULMONARY / CRITICAL CARE MEDICINE   Brief patient description:  71 yo with COPD on home oxygen and CHF, s/p endovascular repair AAA with muliple complications now with VDRF tx to [facility] with oral ETT.   Events Since Admission: 8/1  Acute hypoxemic respiratory failure, rising PEEP, Dopa 20 mcg on arrival, CVP 4 8/2  Remains in shock, PEEP 10, urine output ok 8/4  Fluids KVO's, wound culture grown Pseudomonas, Cipro started for double coverage, Dopamine off - Levophed started, Precedex off - fentanyl started 8/6- remain son pressors lower dose, peep 8  Current Status:  Increasing pressor needs.   Vital Signs:  Reviewed  Physical Examination: General:  Mechanically ventilated, synchronous Neuro:  Nonverbal, opens eyes spontaneously, does not follow commands HEENT:  ETT Neck:  Increased JVD Cardiovascular:  RRR, tachycardiac distant Lungs: scattered rhonchi Abdomen:  Soft, obese, decreased breath sounds Skin:  No rash  Labs:  Lab 02/28/12 1040 02/28/12 0645 02/27/12 0529 02/26/12 0655 02/24/12 0620 02/23/12 0554 02/22/12 0600 03/05/12 1944  HGB 9.6* -- 9.3* 9.6* -- -- -- --  HCT 33.1* -- 31.3* 32.0* -- -- -- --  WBC 25.3* -- 24.2* 18.5* -- -- -- --  PLT 340 -- 342 347 -- -- -- --  NA -- 140 144 143 138 137 -- --  K -- 5.4* 4.2 -- -- -- -- --  CL -- 108 110 106 99 97 -- --  CO2 -- 25 30 28 31 27  -- --  GLUCOSE -- 483* 437* 570* 451* 376* -- --  BUN -- 81* 62* 53* 68* 83* -- --  CREATININE -- 1.33 1.24 1.05 1.10 1.42* -- --  CALCIUM -- 8.1* 7.8* 7.6* 7.1* 7.1* -- --  MG -- -- 3.2* -- -- 4.9* 4.9* 4.8*  PHOS -- -- -- -- -- -- -- --  AST -- 36 -- 53* -- -- -- 166*  ALT -- 51 -- 74* -- -- -- 211*  ALKPHOS -- 143* -- 136* -- -- -- 281*  BILITOT -- 0.9 -- 1.3* -- -- -- 2.1*  PROT -- 4.8* -- 4.7* -- -- -- 5.6*  ALBUMIN -- 1.7* -- 1.6*  -- -- -- 2.0*  INR -- -- -- -- -- -- -- 1.48  APTT -- -- -- -- -- -- -- 26  INR -- -- -- -- -- -- -- 1.48  LATICACIDVEN -- -- -- -- -- -- -- --  TROPONINI -- -- -- -- -- -- -- --    Lab 02/22/12 0417  PHART 7.389  PCO2ART 60.5*  PO2ART 75.2*  HCO3 34.9*  TCO2 36.6  O2SAT 94.6   ASSESSMENT AND PLAN   CXR:  8/6- picc malplaced ( per prim), bilat diffuse infuiltrates, pacer, fluid rt fissure ETT:  7/23 >>>  A:  Acute respiratory failure post AAA endovascualr repair.  GOLD stage IV COPD, no evidence of exacerbation.  Pulmonary edema vs pneumonia vs ARDS.  Worsening CXR. P:  - Remains with peep needs to 8. - Full ventilatory support. - Repeat PCXR. - Continue lasix drip, would add albumin 50cc of 25% IV q6 hours x4 and monitor for effect. - Trach would be medically ineffective and futile as would dialysis and unethical, we will not perform. - Should consider extubation at 14 days in best hopes for survival but family is not ready to make  that decision. - Maintain current MV.  A: Shock (septic vs cardiogenic). 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  Congestive heart failure. P: - Continue Levophed to MAP goals - If remains in need levo by am would add vaso for theoretical vaso deficiency  - Change TLC to PICC line today, done  A:  Improving renal function.  Uremia. P:   - Trend BMP. - Continue negative management.  A:  VAP from [place] upon arrival here, r./o line infection, bacteremia P: Per ID Levo for steno VAP, likely needs min 14 days vanc for line? Mycamine for fungemia. ID following   Extensive discussion with the entire family, they are unwilling to accept how poor the come or make decision as a group but there are a few in there that are agreeable and understanding.  After over an hour discussion, decided to leave primary in there to continue discussion.    CC time 85 min.  Alyson Reedy, M.D. [facility] Pulmonary/Critical Care Medicine. Pager:  [phone]. After hours pager: [phone].

## 2012-02-29 LAB — CBC
Hemoglobin: 9.5 g/dL — ABNORMAL LOW (ref 13.0–17.0)
MCH: 28.2 pg (ref 26.0–34.0)
MCHC: 29.6 g/dL — ABNORMAL LOW (ref 30.0–36.0)
Platelets: 312 10*3/uL (ref 150–400)
RBC: 3.37 MIL/uL — ABNORMAL LOW (ref 4.22–5.81)

## 2012-02-29 LAB — RENAL FUNCTION PANEL
CO2: 23 mEq/L (ref 19–32)
Calcium: 8 mg/dL — ABNORMAL LOW (ref 8.4–10.5)
GFR calc Af Amer: 53 mL/min — ABNORMAL LOW (ref >90)
GFR calc non Af Amer: 46 mL/min — ABNORMAL LOW (ref >90)
Glucose, Bld: 397 mg/dL — ABNORMAL HIGH (ref 70–99)
Phosphorus: 4 mg/dL (ref 2.3–4.6)
Potassium: 6.6 mEq/L (ref 3.5–5.1)
Sodium: 138 mEq/L (ref 135–145)

## 2012-02-29 LAB — VANCOMYCIN, TROUGH
Vancomycin Tr: 39.1 ug/mL (ref 10.0–20.0)
Vancomycin Tr: 35.9 ug/mL (ref 10.0–20.0)

## 2012-02-29 NOTE — Progress Notes (Signed)
Name: Gary [name] MRN: [id number] DOB: 1941-04-02    LOS: 8  Referring Provider:  [name] Reason for Referral:  Acute resp failure  PULMONARY / CRITICAL CARE MEDICINE   Brief patient description:  71 yo with COPD on home oxygen and CHF, s/p endovascular repair AAA with muliple complications now with VDRF tx to [facility] with oral ETT.   Events Since Admission: 8/1  Acute hypoxemic respiratory failure, rising PEEP, Dopa 20 mcg on arrival, CVP 4 8/2  Remains in shock, PEEP 10, urine output ok 8/4  Fluids KVO's, wound culture grown Pseudomonas, Cipro started for double coverage, Dopamine off - Levophed started, Precedex off - fentanyl started 8/6- remain son pressors lower dose, peep 8  Current Status:    Vital Signs:  Reviewed  Physical Examination: General:  Mechanically ventilated, synchronous Neuro:  Nonverbal, opens eyes spontaneously, does not follow commands HEENT:  ETT Neck:  Increased JVD Cardiovascular:  RRR, tachycardiac distant Lungs: scattered rhonchi Abdomen:  Soft, obese, decreased breath sounds Skin:  No rash  Labs:  Lab 02/29/12 0500 02/28/12 1040 02/28/12 0645 02/27/12 0529 02/26/12 0655 02/24/12 0620 02/23/12 0554  HGB 9.5* 9.6* -- 9.3* -- -- --  HCT 32.1* 33.1* -- 31.3* -- -- --  WBC 25.6* 25.3* -- 24.2* -- -- --  PLT 312 340 -- 342 -- -- --  NA 138 -- 140 144 143 138 --  K 6.6* -- 5.4* -- -- -- --  CL 106 -- 108 110 106 99 --  CO2 23 -- 25 30 28 31  --  GLUCOSE 397* -- 483* 437* 570* 451* --  BUN 94* -- 81* 62* 53* 68* --  CREATININE 1.47* -- 1.33 1.24 1.05 1.10 --  CALCIUM 8.0* -- 8.1* 7.8* 7.6* 7.1* --  MG -- -- -- 3.2* -- -- 4.9*  PHOS 4.0 -- -- -- -- -- --  AST -- -- 36 -- 53* -- --  ALT -- -- 51 -- 74* -- --  ALKPHOS -- -- 143* -- 136* -- --  BILITOT -- -- 0.9 -- 1.3* -- --  PROT -- -- 4.8* -- 4.7* -- --  ALBUMIN 1.7* -- 1.7* -- 1.6* -- --  INR -- -- -- -- -- -- --  APTT -- -- -- -- -- -- --  INR -- -- -- -- -- -- --  LATICACIDVEN  -- -- -- -- -- -- --  TROPONINI -- -- -- -- -- -- --   No results found for this basename: PHART:5,PCO2:5,PCO2ART:5,PO2:5,PO2ART:5,HCO3:5,TCO2:5,O2SAT:5 in the last 168 hours ASSESSMENT AND PLAN   CXR:  8/6- picc malplaced ( per prim), bilat diffuse infuiltrates, pacer, fluid rt fissure ETT:  7/23 >>>  A:   Acute respiratory failure post AAA endovascualr repair.  GOLD stage IV COPD, no evidence of exacerbation.  Pulmonary edema vs pneumonia vs ARDS.  Worsening CXR. P:  - Remains with peep needs to 8, fio2 35% - Full ventilatory support. - Repeat PCXR. - Continue lasix drip - Trach would be medically ineffective and futile as would dialysis and unethical, we will not perform. - Should consider extubation at 14 days in best hopes for survival but family is not ready to make that decision. - Maintain current MV.  A: Shock (septic vs cardiogenic). 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  Congestive heart failure. P: - Continue Levophed to MAP goals - PICC line  A:  Improving renal function.  Uremia. P:   - Trend BMP. - Continue negative management.  A:  VAP from [place] upon arrival here, r/o line infection, bacteremia P: Per ID Levo for steno VAP, likely needs min 14 days vanc for line? Mycamine for fungemia. ID following   Per RN, the family has a wedding this weekend and they would like to be able to get through the wedding before withdrawal of care.   Canary Brim, NP-C [facility] Pgr: [phone] or [phone]  Will withdraw care on Sunday afternoon, patient has an implanted defibrillator and suggest deactivation as discussed with primary, would not proceed with any additional weaning.  At this point, maintain comfortable, maintain DNR and terminally extubate on Sunday.  PCCM will sign off, please call back if needed.  Patient seen and examined, agree with above note.  I dictated the care and orders written for this patient under my  direction.  Koren Bound, M.D. [phone]

## 2012-02-29 NOTE — Progress Notes (Signed)
INFECTIOUS DISEASE ATTENDING ADDENDUM:     [facility]   Date: 02/29/2012  Patient name: Gary [name]  Medical record number: [id number]  Date of birth: [birth year]    This patient has been seen and discussed with the house staff. Please see their note for complete details. I concur with their findings with the following additions/corrections:  Paulette Blanch Dam 02/29/2012, 7:02 PM  From talking to the son it appears that the family will withdraw support on Sunday.  From my perspective I am not interested in treating the S. Maltophila with a salvage antibiotic such as colistin. I will dc the levaquin which will only encourage further proliferation of this MDR organism.  With re to the fungemia mycafungin is rational  I think meropenem is reasonable with vancomycin for further broad spectrum coverage of septic shock, though we have no clear cut source whatsoever.  This is CLEARLY A FUTILE CASE.   I am going forward under the assumption that aggressive life support will be withdrawn on Sunday.  I will sign off for now please call back with further questions.

## 2012-02-29 NOTE — Progress Notes (Signed)
[facility]    Date of Admission:  03/08/2012     Total days of antibiotics 9  Levaquin 7/30-7/31, 8/5-current  Vanc Day 6  Flagyl Day 6  Micafungin Day 3  Imipenem 5 days - DC  Active Problems:  Acute respiratory failure with hypoxia  Septic shock  COPD (chronic obstructive pulmonary disease)  ARDS (adult respiratory distress syndrome)  Subjective: Pt is unresponsive to verbal commands and pain. Unable to assess for any overnight events from pt. Nursing denies any acute changes.  Objective: Vitals 97.5 F, 105/47, HR 88, RR 22, 92% General: not arousable, responds minimally to verbal commands or pain  HEENT: thick neck,  Cardiac: RRR, no rubs, murmurs or gallops  Pulm: course breath sounds b/l, rhonchi heard  Abd: distended, tender, minimal bowel sounds  Skin: edema on b/l upper extremities, drainage from bilateral groin sites from endovascular access  Neuro: nonfocal   Lab Results Lab Results  Component Value Date   WBC 25.6* 02/29/2012   HGB 9.5* 02/29/2012   HCT 32.1* 02/29/2012   MCV 95.3 02/29/2012   PLT 312 02/29/2012    Lab Results  Component Value Date   CREATININE 1.47* 02/29/2012   BUN 94* 02/29/2012   NA 138 02/29/2012   K 6.6* 02/29/2012   CL 106 02/29/2012   CO2 23 02/29/2012    Lab Results  Component Value Date   ALT 51 02/28/2012   AST 36 02/28/2012   ALKPHOS 143* 02/28/2012   BILITOT 0.9 02/28/2012      Microbiology: Recent Results (from the past 240 hour(s))  CULTURE, BLOOD (ROUTINE X 2)     Status: Normal   Collection Time   03/14/2012  7:10 PM      Component Value Range Status Comment   Specimen Description BLOOD RIGHT FOOT   Final    Special Requests BOTTLES DRAWN AEROBIC ONLY [facility]   Final    Culture  Setup Time 02/22/2012 02:15   Final    Culture NO GROWTH 5 DAYS   Final    Report Status 02/28/2012 FINAL   Final   CULTURE, BLOOD (ROUTINE X 2)     Status: Normal   Collection Time   03/10/2012  7:19 PM      Component Value Range Status  Comment   Specimen Description BLOOD RIGHT ANKLE   Final    Special Requests     Final    Value: BOTTLES DRAWN AEROBIC AND ANAEROBIC BLUE 7CC RED 5CC   Culture  Setup Time 02/22/2012 02:15   Final    Culture NO GROWTH 5 DAYS   Final    Report Status 02/28/2012 FINAL   Final   CULTURE, RESPIRATORY     Status: Normal   Collection Time   03/16/2012  9:40 PM      Component Value Range Status Comment   Specimen Description TRACHEAL ASPIRATE   Final    Special Requests NONE   Final    Gram Stain     Final    Value: FEW WBC PRESENT,BOTH PMN AND MONONUCLEAR     NO SQUAMOUS EPITHELIAL CELLS SEEN     NO ORGANISMS SEEN   Culture FEW STENOTROPHOMONAS MALTOPHILIA   Final    Report Status 02/24/2012 FINAL   Final    Organism ID, Bacteria STENOTROPHOMONAS MALTOPHILIA   Final   CULTURE, BLOOD (ROUTINE X 2)     Status: Normal   Collection Time   03/19/2012 11:00 PM  Component Value Range Status Comment   Specimen Description BLOOD CENTRAL LINE   Final    Special Requests BOTTLES DRAWN AEROBIC AND ANAEROBIC 10CC RIGHT IJ   Final    Culture  Setup Time 02/22/2012 03:11   Final    Culture NO GROWTH 5 DAYS   Final    Report Status 02/28/2012 FINAL   Final   WOUND CULTURE     Status: Normal   Collection Time   02/22/12  1:14 AM      Component Value Range Status Comment   Specimen Description WOUND LEFT GROIN   Final    Special Requests NONE   Final    Gram Stain     Final    Value: NO WBC SEEN     FEW SQUAMOUS EPITHELIAL CELLS PRESENT     NO ORGANISMS SEEN   Culture FEW PSEUDOMONAS AERUGINOSA   Final    Report Status 02/25/2012 FINAL   Final    Organism ID, Bacteria PSEUDOMONAS AERUGINOSA   Final   WOUND CULTURE     Status: Normal   Collection Time   02/22/12  2:13 AM      Component Value Range Status Comment   Specimen Description WOUND RIGHT GROIN   Final    Special Requests NONE   Final    Gram Stain     Final    Value: NO WBC SEEN     FEW SQUAMOUS EPITHELIAL CELLS PRESENT     NO ORGANISMS  SEEN   Culture FEW PSEUDOMONAS AERUGINOSA   Final    Report Status 02/25/2012 FINAL   Final    Organism ID, Bacteria PSEUDOMONAS AERUGINOSA   Final   CULTURE, BLOOD (SINGLE)     Status: Normal   Collection Time   02/22/12  9:20 AM      Component Value Range Status Comment   Specimen Description BLOOD   Final    Special Requests     Final    Value: BOTTLES DRAWN AEROBIC AND ANAEROBIC 10CC WHITE PORT CVP   Culture  Setup Time 02/22/2012 16:52   Final    Culture NO GROWTH 5 DAYS   Final    Report Status 02/28/2012 FINAL   Final   CULTURE, BLOOD (ROUTINE X 2)     Status: Normal (Preliminary result)   Collection Time   02/25/12  1:35 PM      Component Value Range Status Comment   Specimen Description BLOOD RIGHT HAND   Final    Special Requests BOTTLES DRAWN AEROBIC AND ANAEROBIC 10CC   Final    Culture  Setup Time 02/25/2012 19:31   Final    Culture     Final    Value:        BLOOD CULTURE RECEIVED NO GROWTH TO DATE CULTURE WILL BE HELD FOR 5 DAYS BEFORE ISSUING A FINAL NEGATIVE REPORT   Report Status PENDING   Incomplete   CULTURE, BLOOD (ROUTINE X 2)     Status: Normal (Preliminary result)   Collection Time   02/26/12  3:00 AM      Component Value Range Status Comment   Specimen Description BLOOD RIGHT ARM   Final    Special Requests BOTTLES DRAWN AEROBIC AND ANAEROBIC 10CC [facility]   Final    Culture  Setup Time 02/26/2012 08:52   Final    Culture     Final    Value: YEAST     Note: Gram Stain Report Called to,Read Back By  and Verified With: Wynona Dove @1305  02/27/12 BY KRAWS   Report Status PENDING   Incomplete   CATH TIP CULTURE     Status: Normal   Collection Time   02/26/12  3:15 AM      Component Value Range Status Comment   Specimen Description CATH TIP   Final    Special Requests RIGHT IJ   Final    Culture NO GROWTH 2 DAYS   Final    Report Status 02/28/2012 FINAL   Final   CULTURE, RESPIRATORY     Status: Normal (Preliminary result)   Collection Time   02/26/12  5:10 PM       Component Value Range Status Comment   Specimen Description TRACHEAL ASPIRATE   Final    Special Requests NONE   Final    Gram Stain     Final    Value: FEW WBC PRESENT,BOTH PMN AND MONONUCLEAR     FEW SQUAMOUS EPITHELIAL CELLS PRESENT     NO ORGANISMS SEEN   Culture     Final    Value: FEW STENOTROPHOMONAS MALTOPHILIA     Note: CRITICAL RESULT CALLED TO, READ BACK BY AND VERIFIED WITH: TRENESSA D@9 :30AM ON 02/29/12 BY DANTS   Report Status PENDING   Incomplete    Organism ID, Bacteria STENOTROPHOMONAS MALTOPHILIA   Final     Studies/Results: Dg Chest Port 1 View  02/28/2012  *RADIOLOGY REPORT*  Clinical Data: Abdominal pain, sepsis  PORTABLE CHEST - 1 VIEW  Comparison: 02/27/2012  Findings: Cardiomegaly again noted.  Dual lead cardiac pacemaker is unchanged in position.  Stable endotracheal tube position.  Stable right PICC line position.  There is worsening in aeration with central vascular congestion and perihilar interstitial prominence consistent with worsening pulmonary edema.  Probable small bilateral pleural effusion with bilateral basilar atelectasis or infiltrate. Stable NG tube position.  IMPRESSION:  Stable endotracheal tube position.  Stable right PICC line position.  There is worsening in aeration with central vascular congestion and perihilar interstitial prominence consistent with worsening pulmonary edema.  Probable small bilateral pleural effusion with bilateral basilar atelectasis or infiltrate.  Original Report Authenticated By: Natasha Mead, M.D.   Dg Chest Port 1 View  02/27/2012  *RADIOLOGY REPORT*  Clinical Data: PICC line placement, intubated  PORTABLE CHEST - 1 VIEW  Comparison: 02/25/2012  Findings: Right PICC line tip is at the level of the SVC. Endotracheal tube 6 cm above the carina.  NG tube remains in place. Left subclavian AICD evident.  Cardiomegaly noted with some improvement in diffuse edema pattern.  Residual basilar atelectasis noted.  No enlarging effusion or  pneumothorax.  IMPRESSION: Right PICC line tip at the level of the SVC.  Improving CHF  Original Report Authenticated By: Judie Petit. Ruel Favors, M.D.   Dg Abd Portable 1v  02/28/2012  *RADIOLOGY REPORT*  Clinical Data: Ileus.  Abdominal pain.  PORTABLE ABDOMEN - 1 VIEW  Comparison: 02/23/2012.  Findings: Gas distended small and large bowel.  The cecum measures up to 11.3 cm.  Small bowel measures up to 5.5 cm.  This represents progressive gaseous distension of bowel when compared to the prior examination.  It is possible this is related to an ileus.  Distal obstructing lesion not excluded.  The possibility of free intraperitoneal air cannot be addressed on a supine view.  IMPRESSION: Progressive gas distended bowel as noted above.  This is a call report.  Original Report Authenticated By: Fuller Canada, M.D.    Assessment: 71  yo male with a PMH of DM, HTN, chronic A fib, end stage COPD, sleep apnea on nightly BiPAP, presented to his doctor's office with intractable nausea, vomiting, weight loss, near syncope, poor appetite. He was found to have increased LFTs and went to the ED. 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 AAA was repaired on 7/25, was found to have respiratory failure and hypotensive and had to be intubated after the procedure. Pt had a high FiO2 requirement and was not responsive to steroids and levaquine and could not be weaned off of ventilator.  Pt continues to have fevers, hypotension, and can not be weaned off ventilation. We are concerned about possible bacteremia vs. AAA repair infection vs. Ventilation associated pneumonia. Source of infection is unknown, as is any specific bacterial culprit for the patient's continued poor health status.   Plan:  1. Persistent Fevers and hypotension: Differential includes AAA endovascular infection, intrabdominal abscess, VAP, drug fever, wound infection possible, but less likely.  -blood culture grew yeast, started micafungin on 02/27/12  -CT  Angiogram to examine AAA site not feasable  -continue levaquin, vancomycin and flagyl  2. Possible ventilator associated pneumonia: S maltophila may reflect colonization and antibiotic pressure for R organisms. Continue Levaquin. Lab called and informed us of resistance formation of Maltophilia bacteria. Will discuss with Dr. Daiva Eves if any changes in abx are required.  3. 2 wounds with Pseudomonas in left and right groin area  -likely a colonizer  4. S/p AAA repair  - questionable infected AAA repair  - see above  Pt's family will meet again on Sunday and decide whether or not to continue therapy and change to comfort care.   Lenox Ahr for Infectious [facility] 02/29/2012, 10:49 AM  This is a Medical Student Note. The care of the patient was discussed with Dr. Daiva Eves and the assessment and plan was formulated with their assistance. Please see their note for official documentation of the patient encounter.

## 2012-03-01 LAB — CULTURE, RESPIRATORY W GRAM STAIN

## 2012-03-02 LAB — RENAL FUNCTION PANEL
Albumin: 1.8 g/dL — ABNORMAL LOW (ref 3.5–5.2)
Calcium: 7.9 mg/dL — ABNORMAL LOW (ref 8.4–10.5)
Creatinine, Ser: 1.47 mg/dL — ABNORMAL HIGH (ref 0.50–1.35)
GFR calc Af Amer: 53 mL/min — ABNORMAL LOW (ref >90)
GFR calc non Af Amer: 46 mL/min — ABNORMAL LOW (ref >90)
Phosphorus: 4.7 mg/dL — ABNORMAL HIGH (ref 2.3–4.6)
Sodium: 138 mEq/L (ref 135–145)

## 2012-03-02 LAB — CBC
MCH: 28.7 pg (ref 26.0–34.0)
MCV: 92.7 fL (ref 78.0–100.0)
Platelets: 219 10*3/uL (ref 150–400)
RDW: 18.8 % — ABNORMAL HIGH (ref 11.5–15.5)

## 2012-03-02 LAB — CULTURE, BLOOD (ROUTINE X 2)

## 2012-03-23 NOTE — Progress Notes (Signed)
*   Chaplain responded to pg for [phone].  Family requested a chaplain to provide support during pt extubation and expected decline.  I introduced myself to pt family in conference room and offered pastoral presence and support.  Family requested prayer with pt before extubation, so we all gathered at bedside with immediate family and I offered a prayer of blessing for the pt.  Remained present in rm with pt youngest brother during extubation procedures.  Provided grief support to family during this time.  A family member's pastor arrived and offered prayers, but had to leave.  I offered to be available as needed, and will make referral to the chaplain coming on-call after me if pt continues to linger.  Family thanked me for my support and presence.  I will follow-up as needed or requested.

## 2012-03-23 DEATH — deceased

## 2012-06-01 IMAGING — CR DG CHEST 2V
1 series · 3 of 3 positions shown · non-contrast
Comparison: none

REASON FOR EXAM: shortness of breath
COMMENTS:

[Series 1: view not recorded · 0.17mm/px · 3 of 3 slices shown]
[im 1/3]
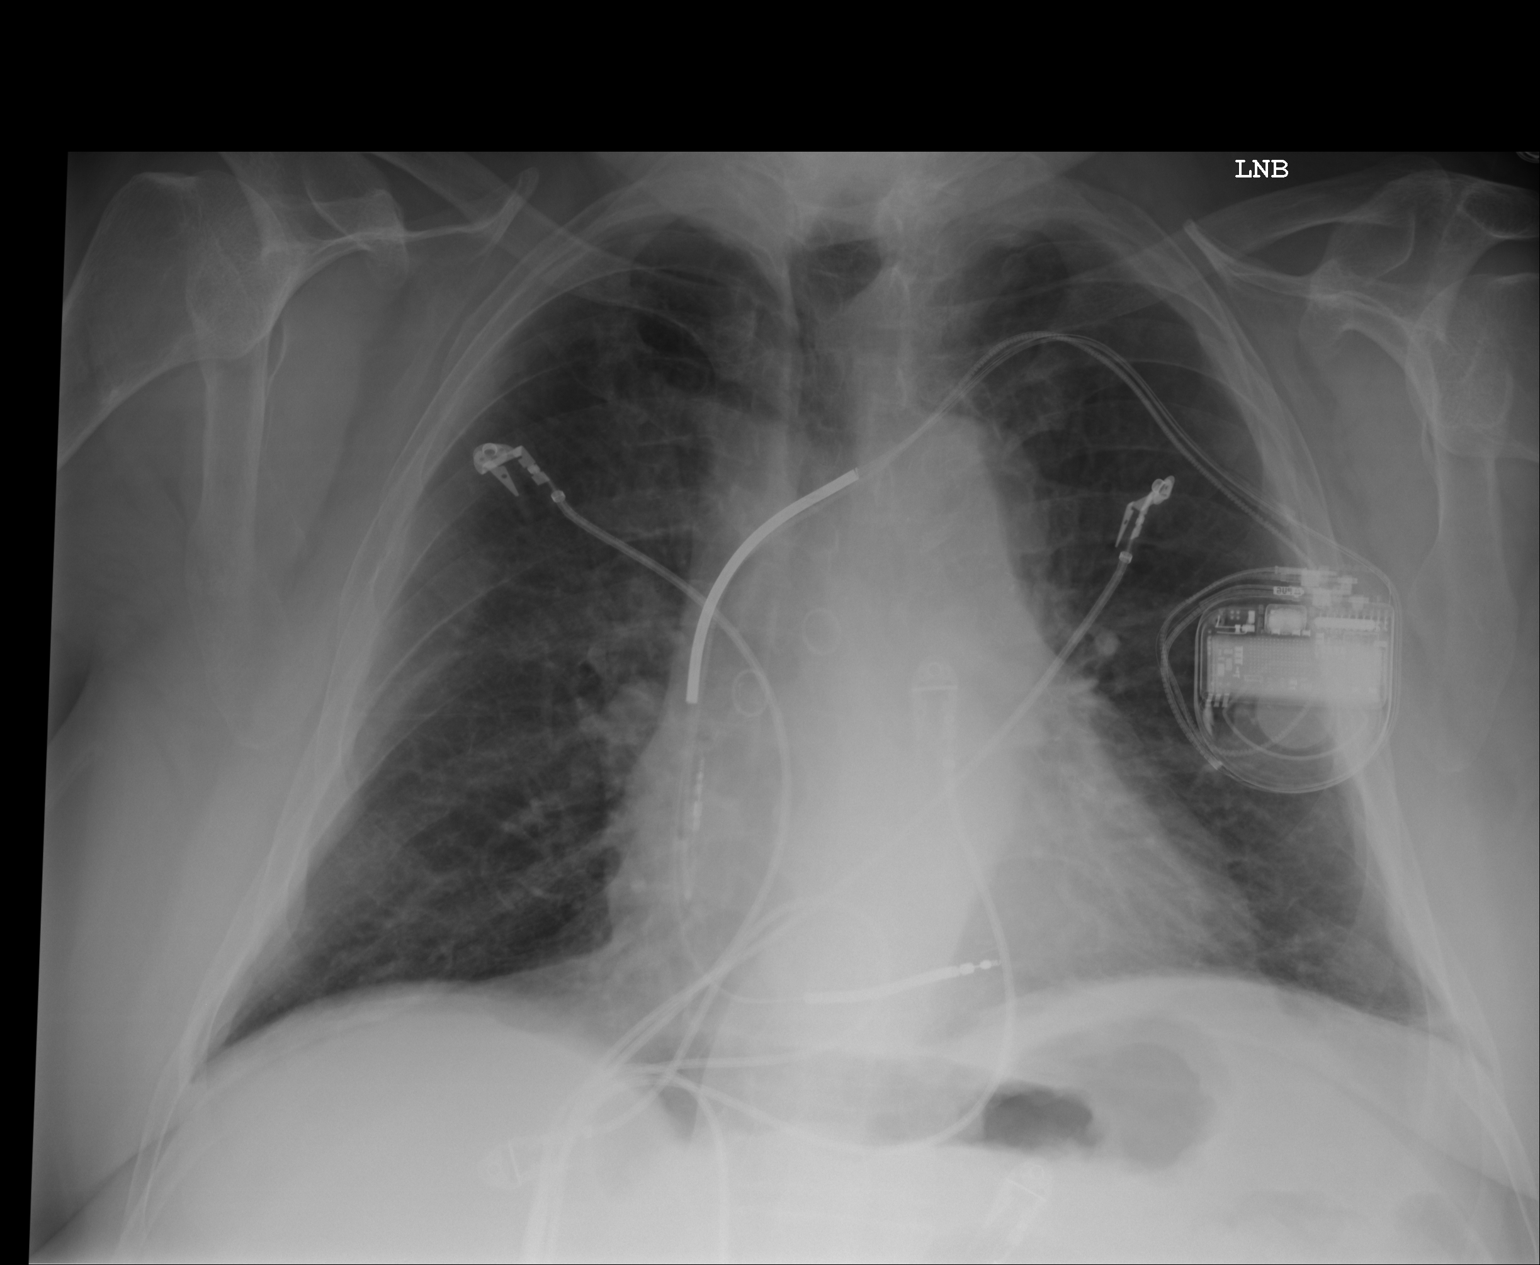
[im 2/3]
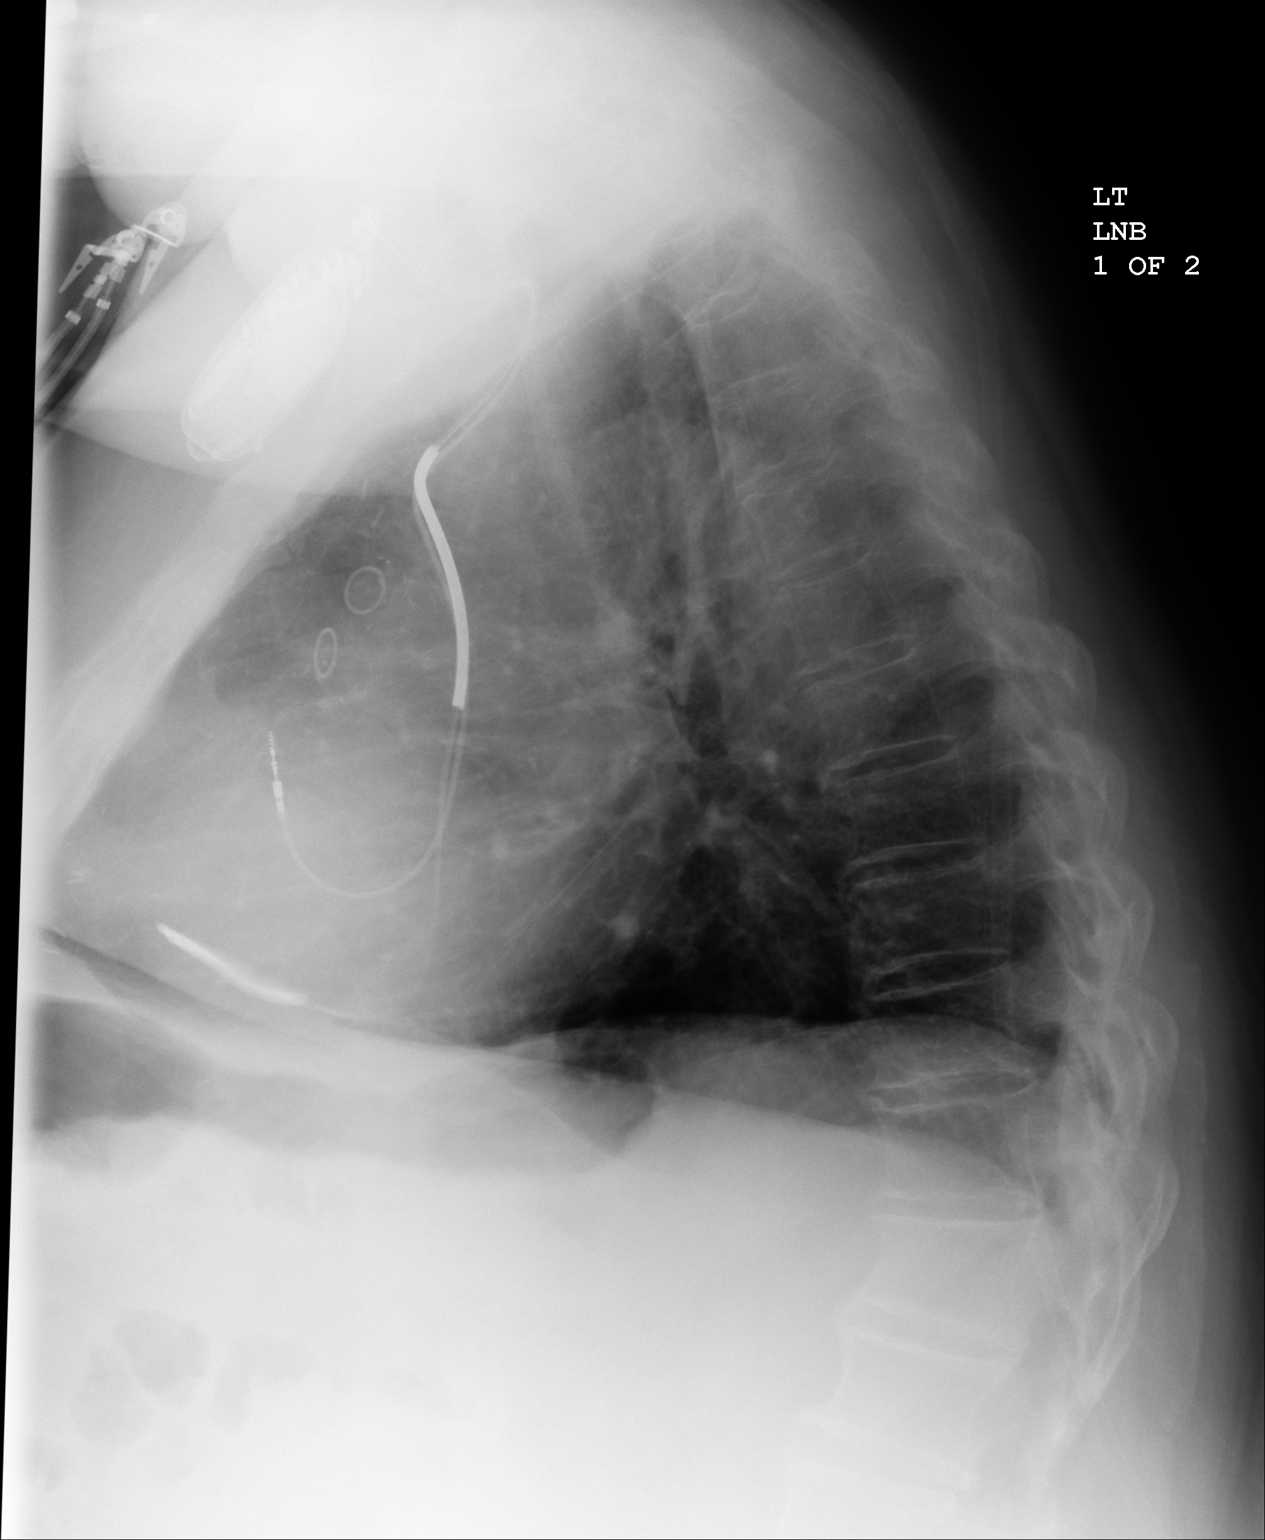
[im 3/3]
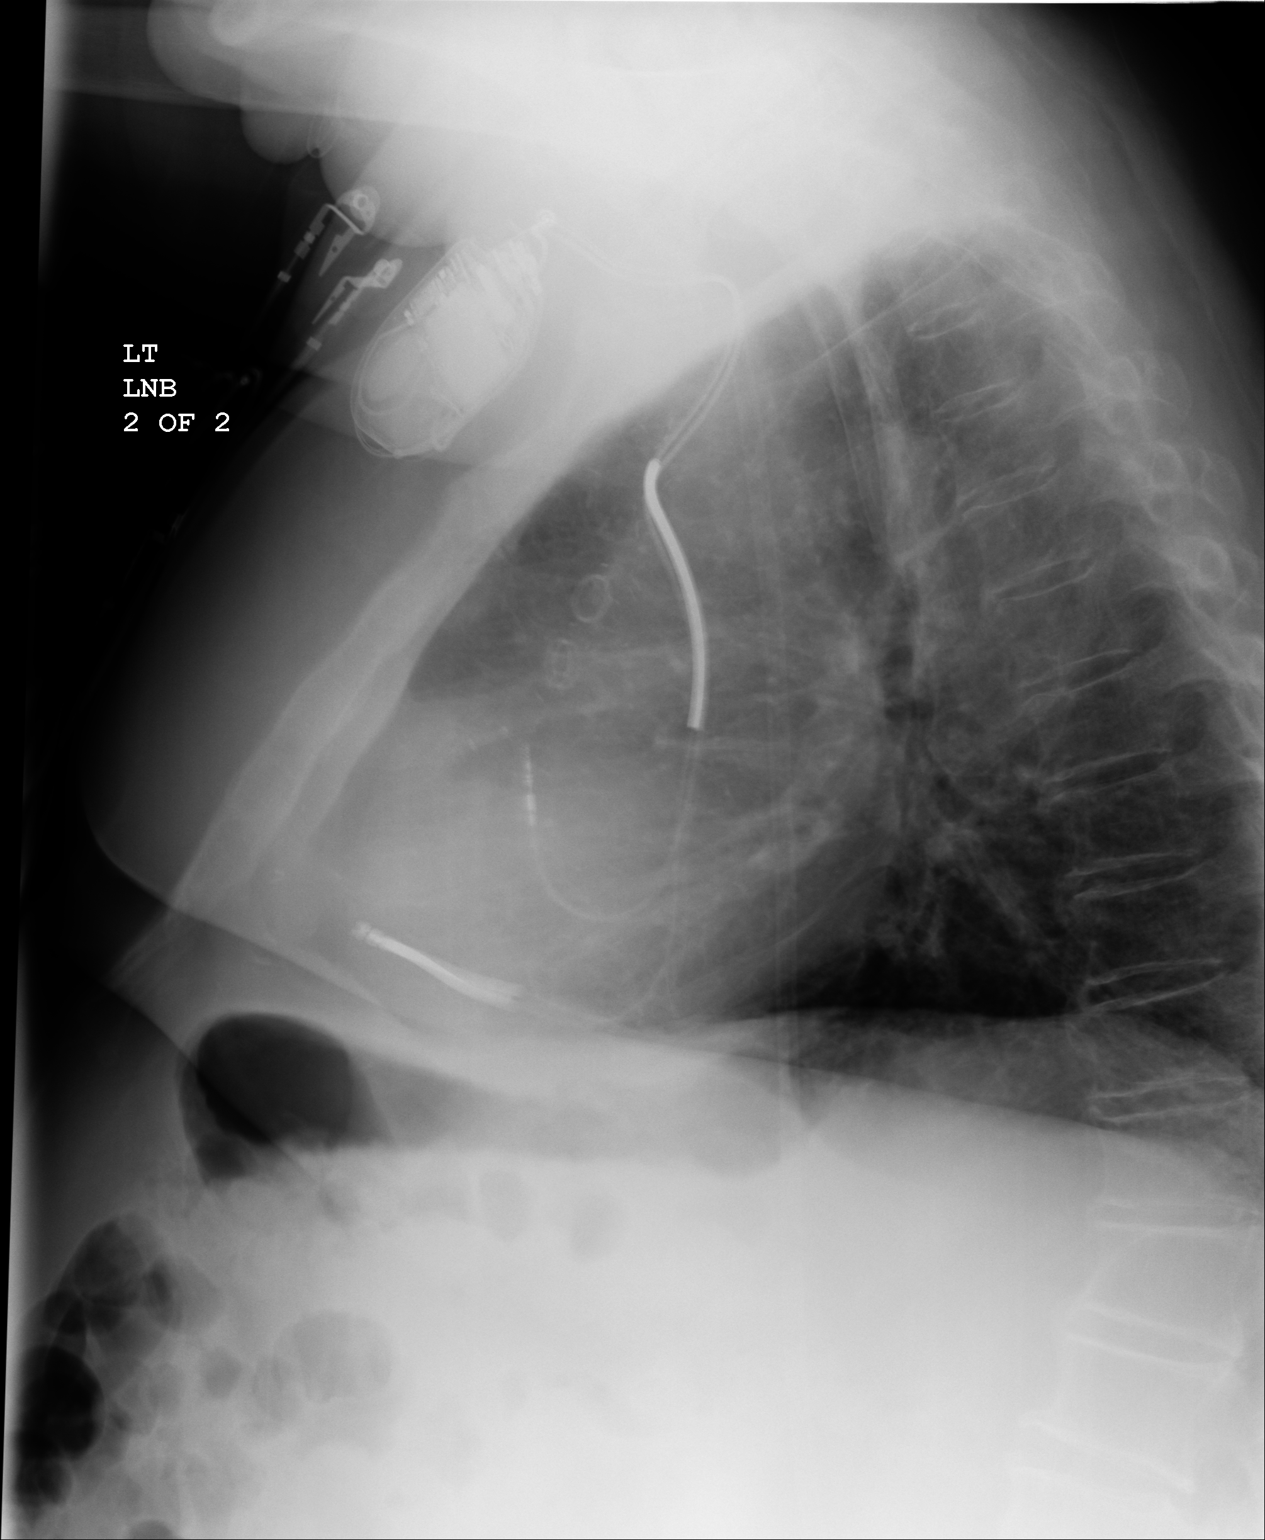

[3 of 3 positions shown; findings below may reference images not displayed]

PROCEDURE:     DXR - DXR CHEST PA (OR AP) AND LATERAL  - July 18, 2010  [DATE]

RESULT:     Comparison is made to study 24 April, 2009.

The lungs are well-expanded. The cardiac silhouette is mildly enlarged
though stable. The perihilar lung markings are minimally prominent. The
interstitial edema seen previously is no longer evident. I see no pleural
effusion. There is a permanent pacemaker-defibrillator in place.
IMPRESSION: The findings suggest low-grade compensated CHF. I do not
see alveolar edema and no more than minimal interstitial edema is present.

## 2013-03-14 IMAGING — CR DG CHEST 2V
1 series · 3 of 3 positions shown · non-contrast
Comparison: none

REASON FOR EXAM: respiratory failure. chf
COMMENTS:

PROCEDURE:     DXR - DXR CHEST PA (OR AP) AND LATERAL  - April 30, 2011  [DATE]
RESULT:     Comparison is made to prior study dated 02/11/2011.

[Series 1: view not recorded · 0.17mm/px · 3 of 3 slices shown]
[im 1/3]
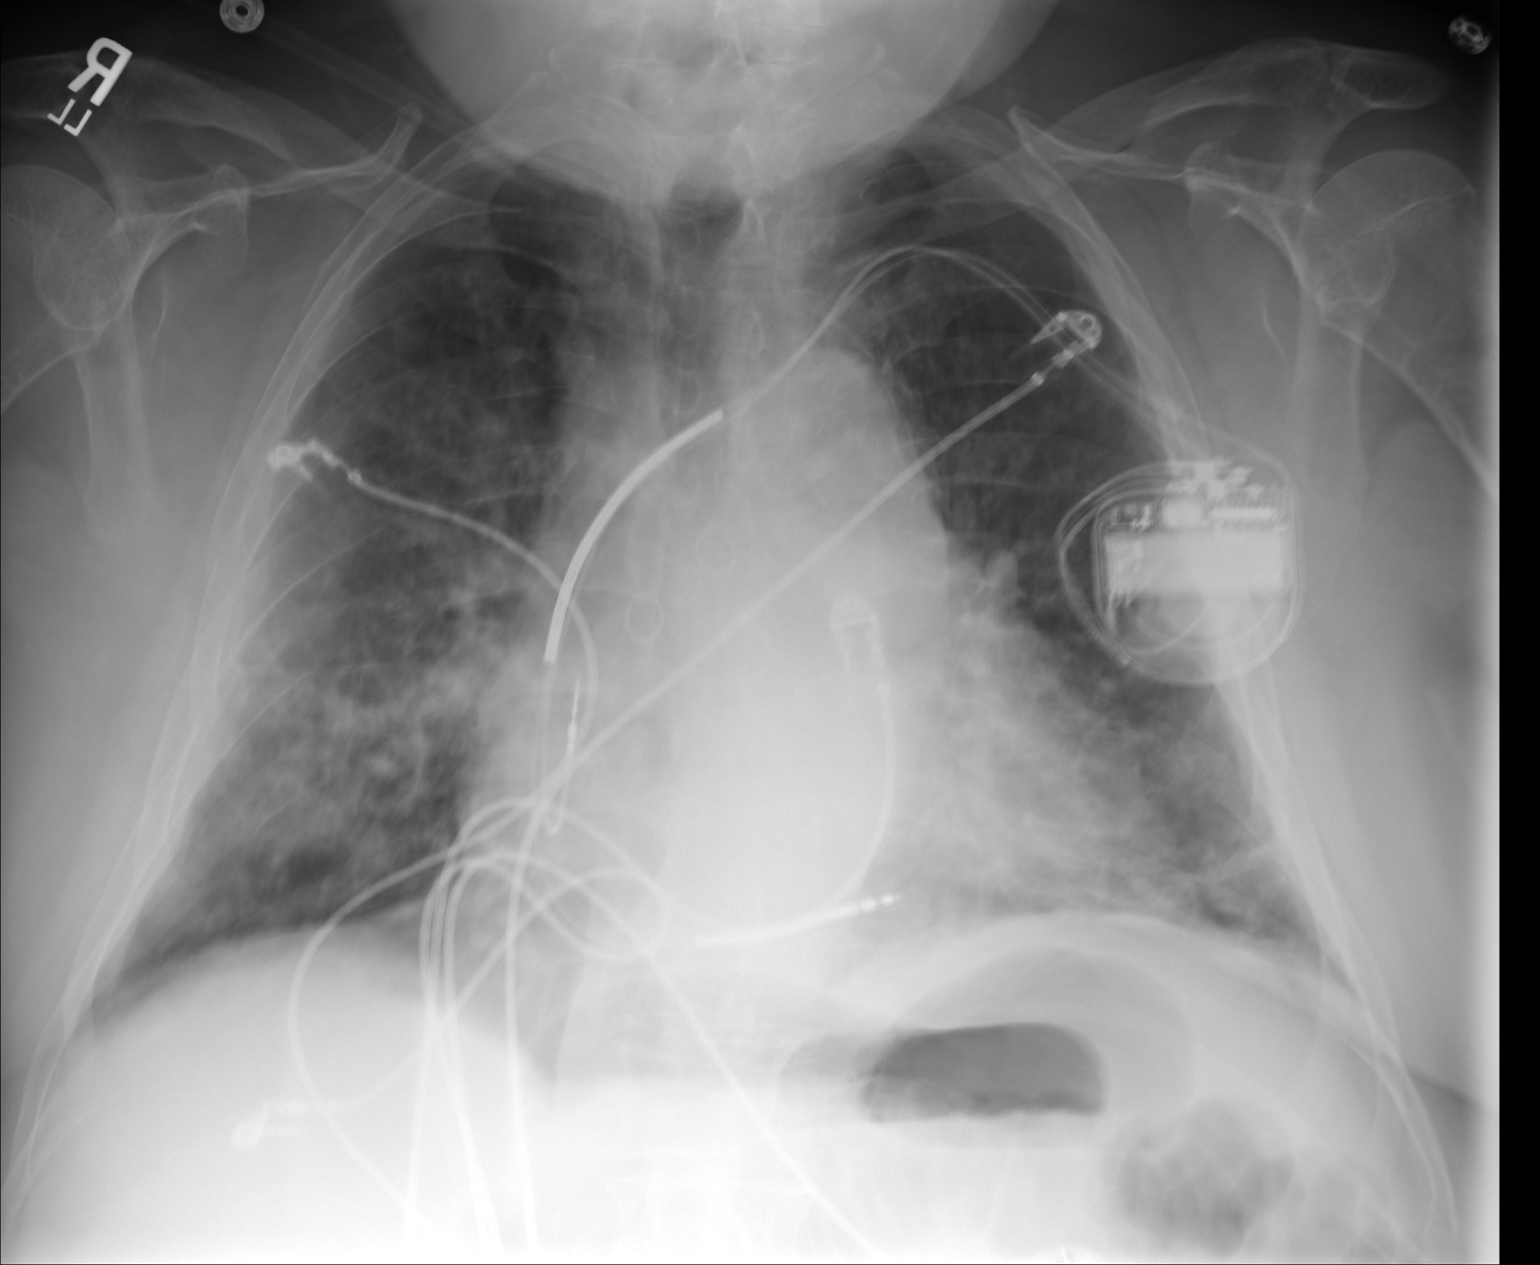
[im 2/3]
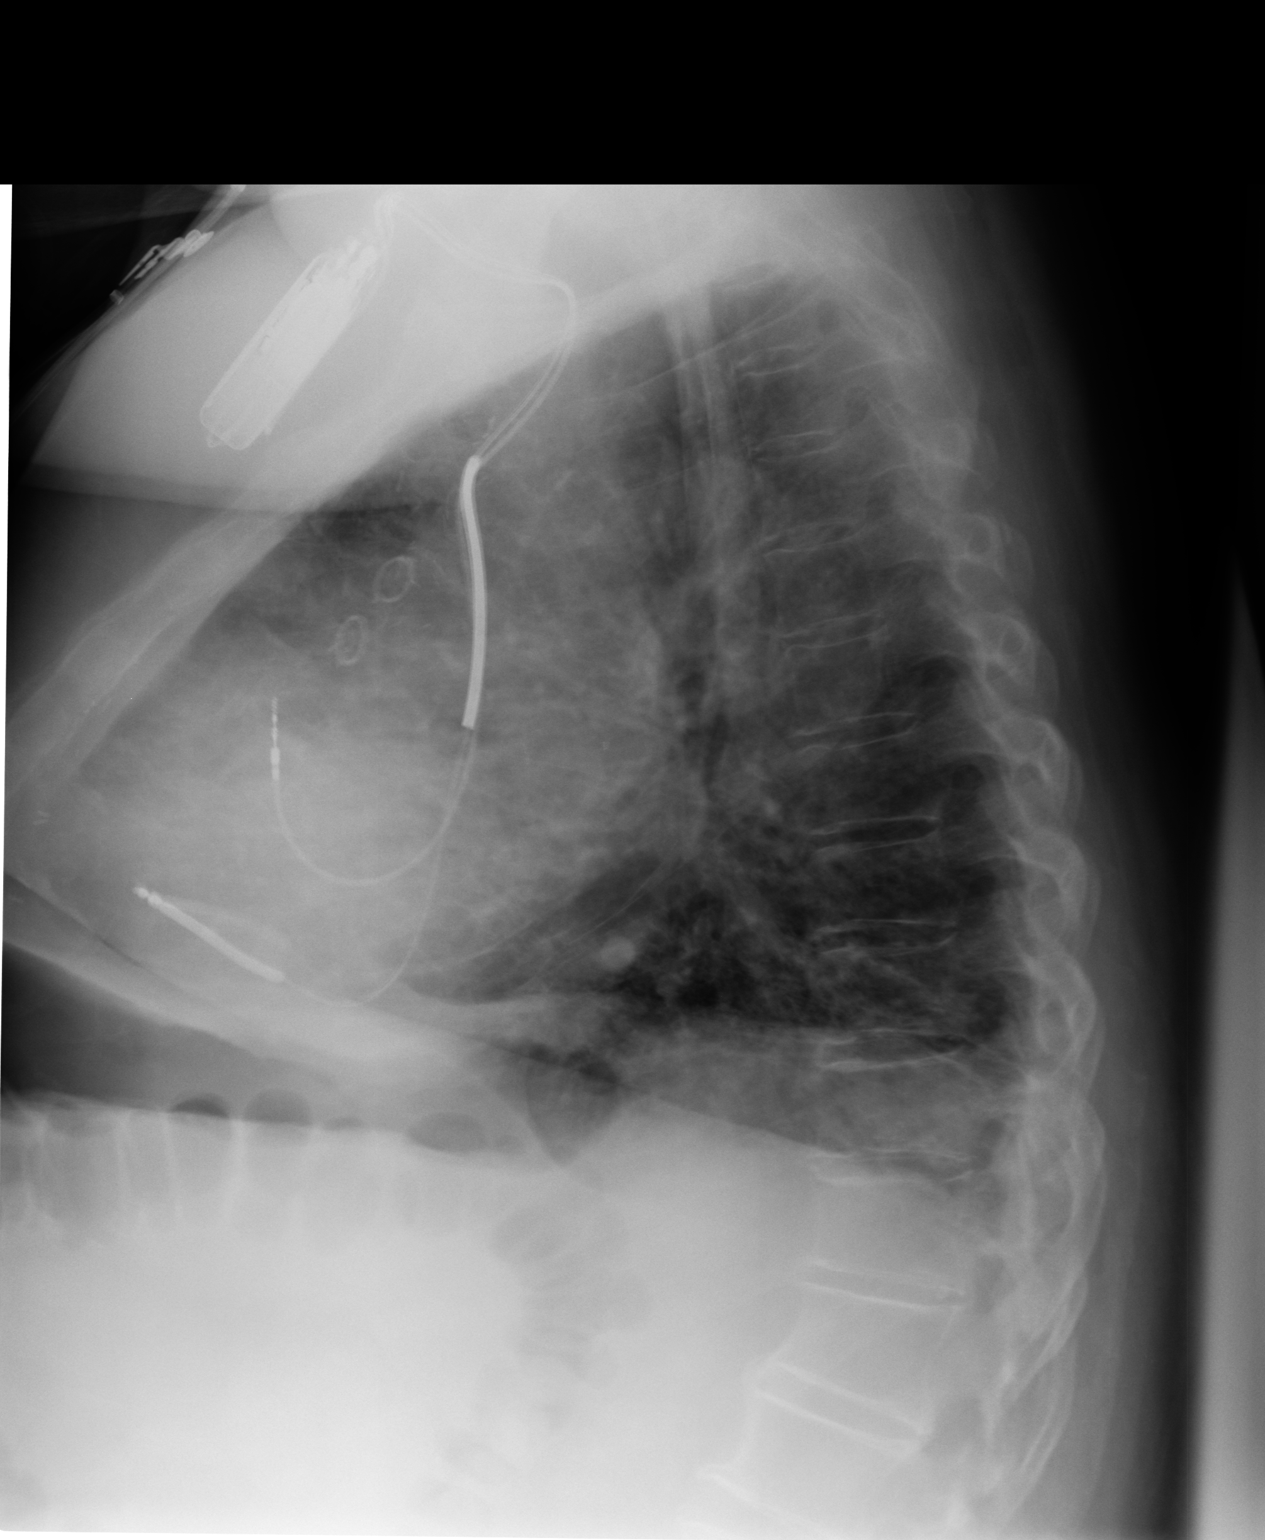
[im 3/3]
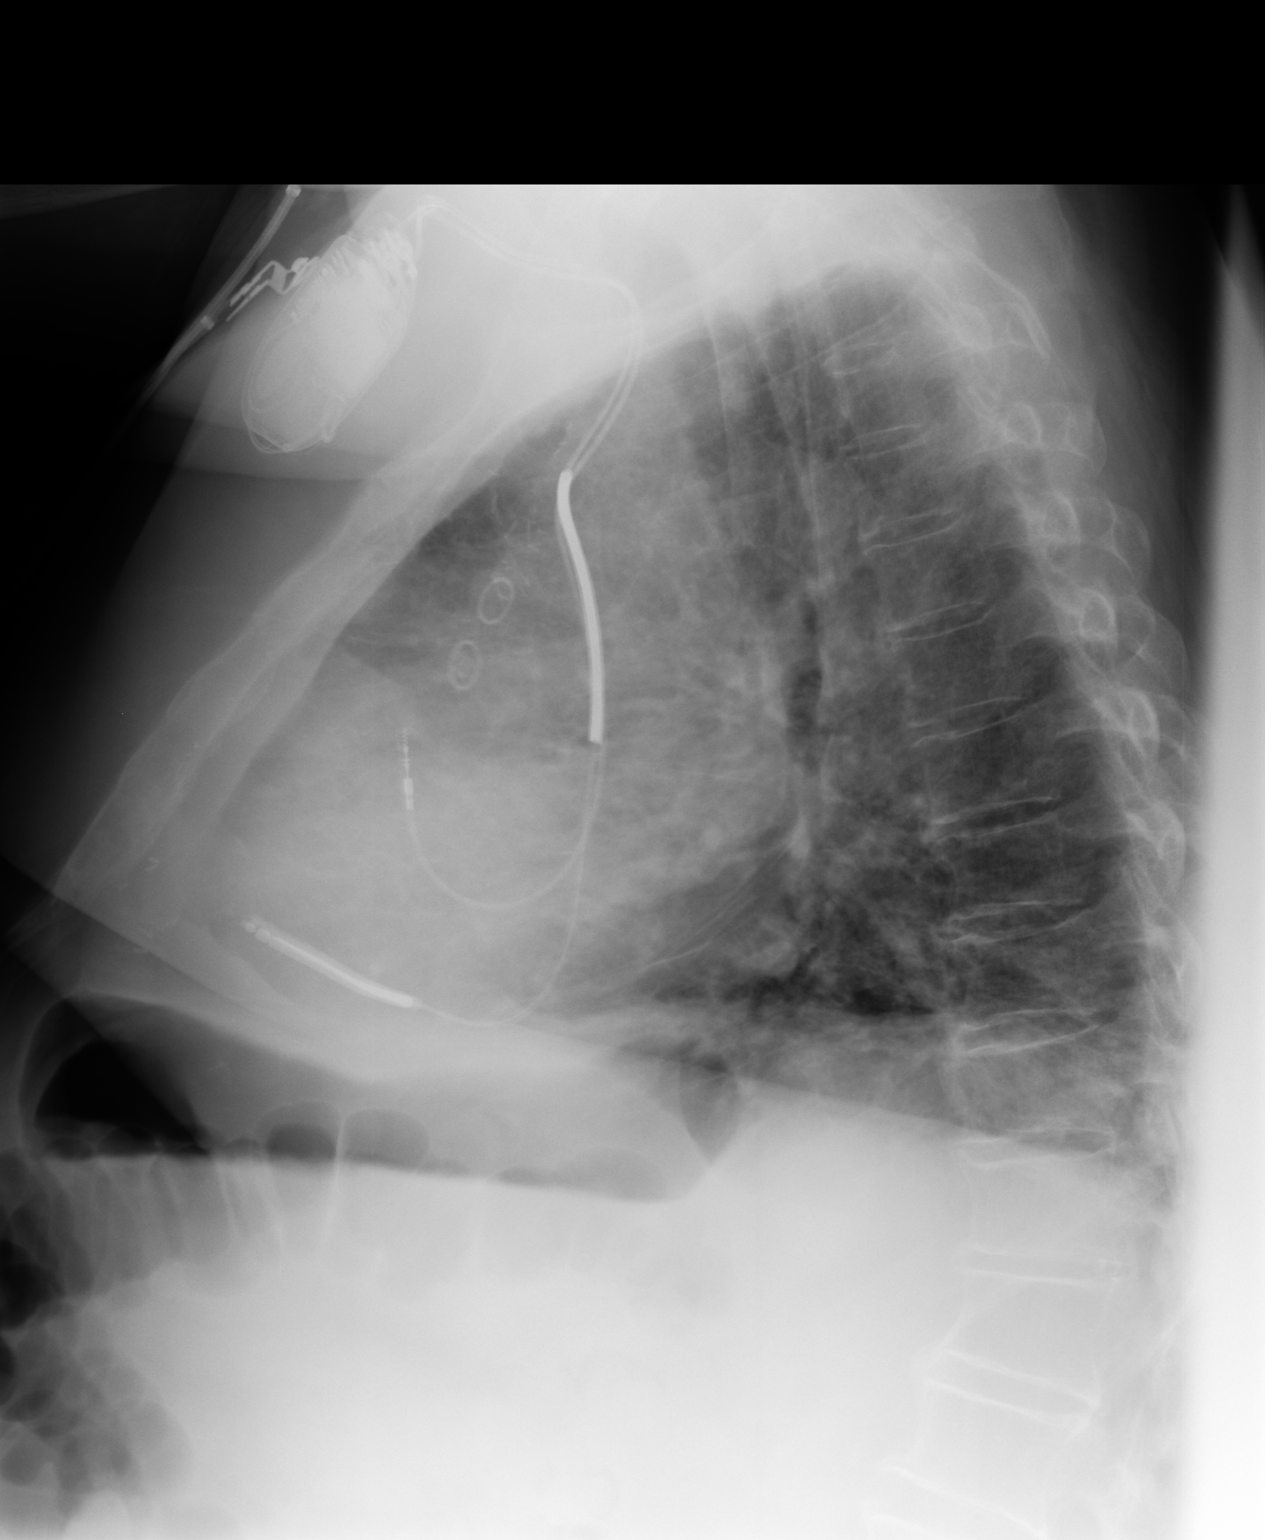

[3 of 3 positions shown; findings below may reference images not displayed]

FINDINGS: There is thickening of the interstitial markings and peribronchial
cuffing. No focal regions of consolidation are identified. The cardiac
silhouette is enlarged indicative of cardiomegaly. This study is degraded by
motion artifact. A left-sided pectoralis pacing unit is identified with lead
tips projecting in the region of the right atrium and right ventricle. The
visualized bony skeleton demonstrates no evidence of acute fracture.
IMPRESSION: Interstitial infiltrate edematous versus nonedematous as
described above.

## 2013-12-21 IMAGING — US ABDOMEN ULTRASOUND LIMITED
1 series · 14 of 22 positions shown · non-contrast
Comparison: none

REASON FOR EXAM: abd pain; elevated liver enzymes
COMMENTS:   Body Site: GB and Fossa, CBD, Head of Pancreas

PROCEDURE:     US  - US ABDOMEN LIMITED SURVEY  - February 06, 2012  [DATE]
RESULT:

[Series 1: abdomen ultrasound limited · 0.31mm/px · 14 of 22 slices shown]
[im 1/22]
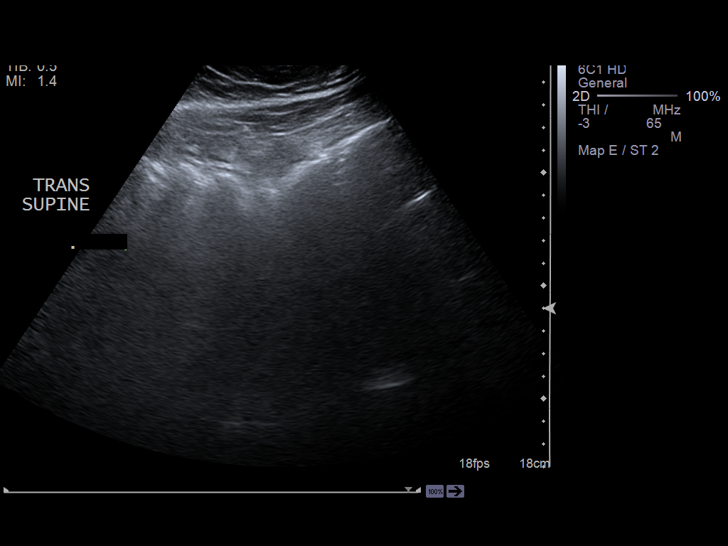
[im 3/22]
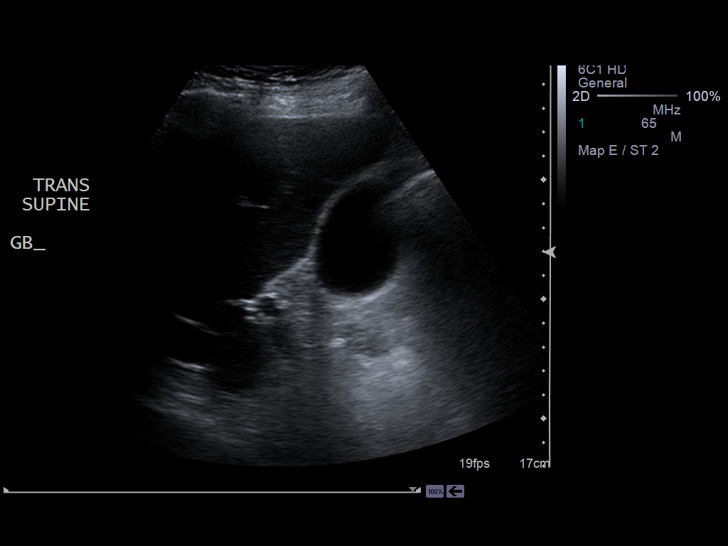
[im 4/22]
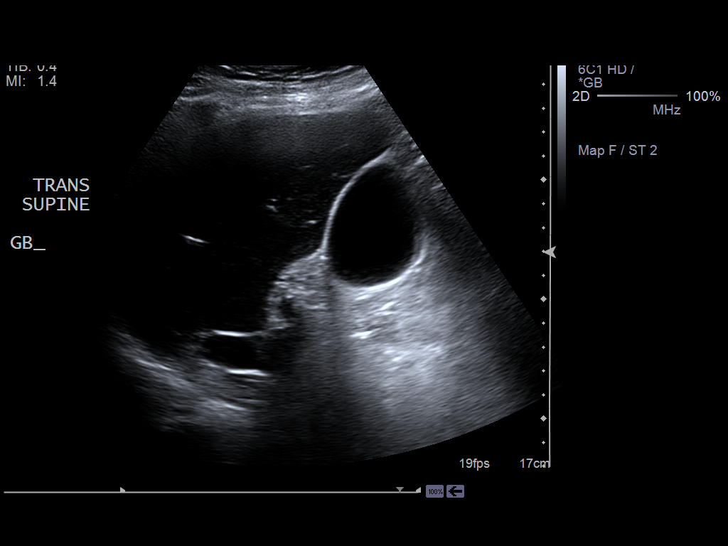
[im 6/22]
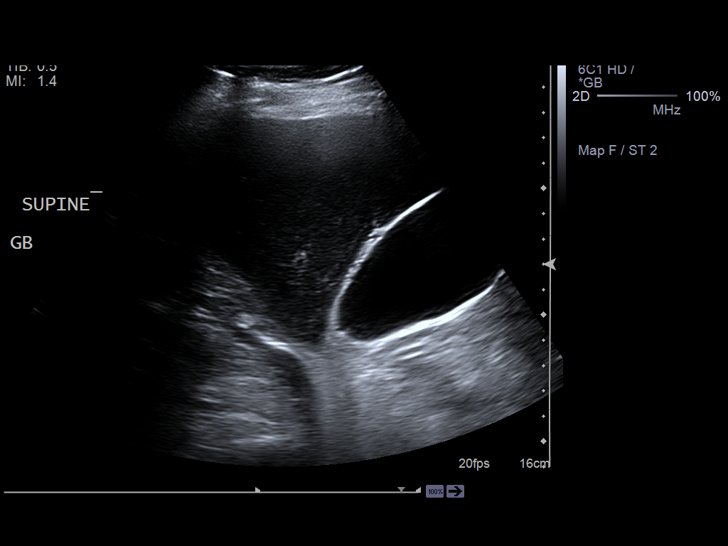
[im 8/22]
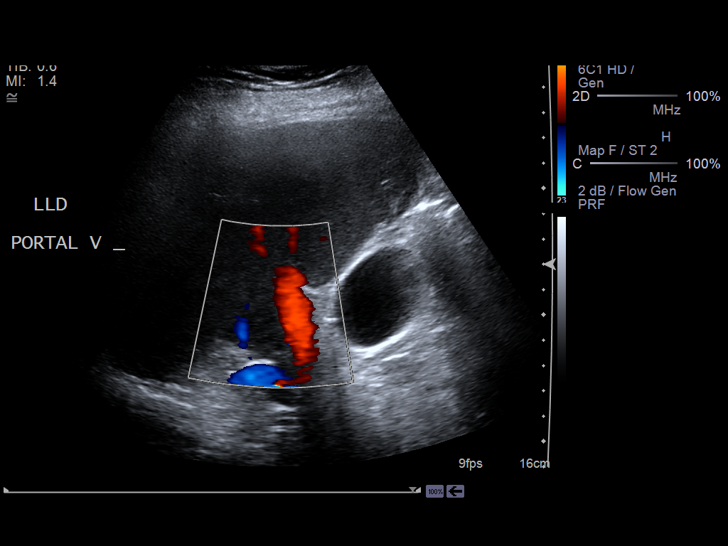
[im 9/22]
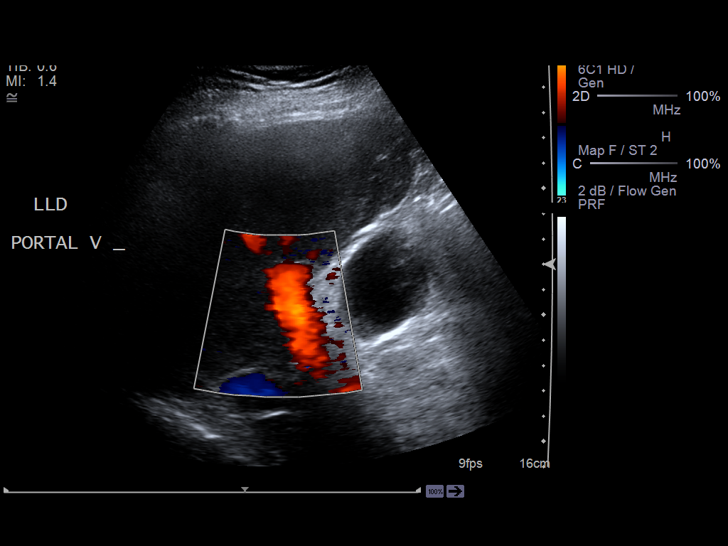
[im 11/22]
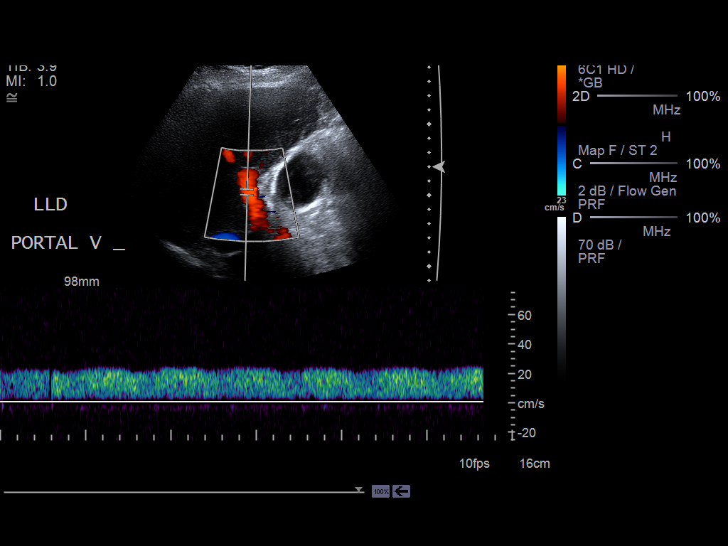
[im 12/22]
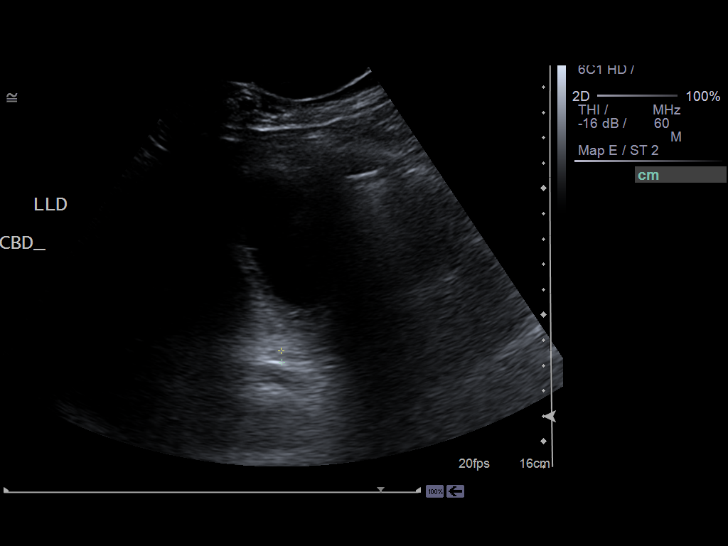
[im 14/22]
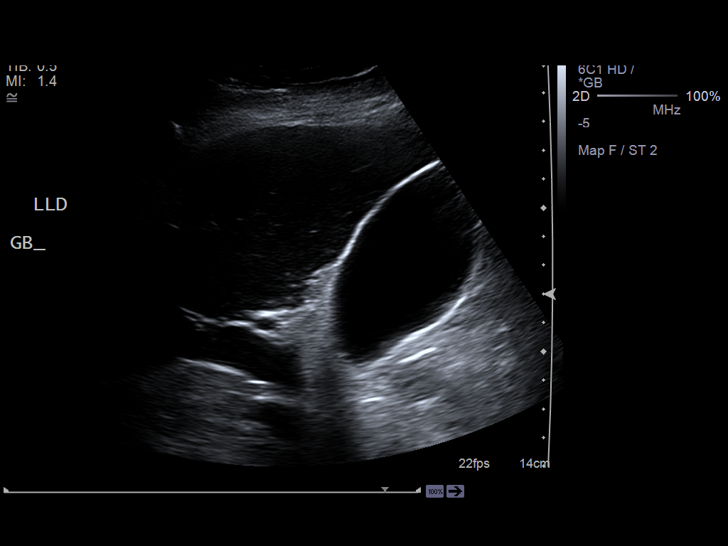
[im 15/22]
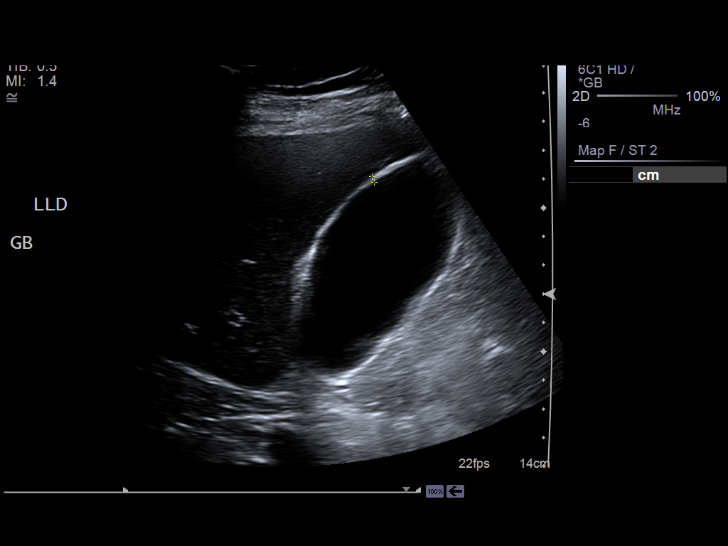
[im 17/22]
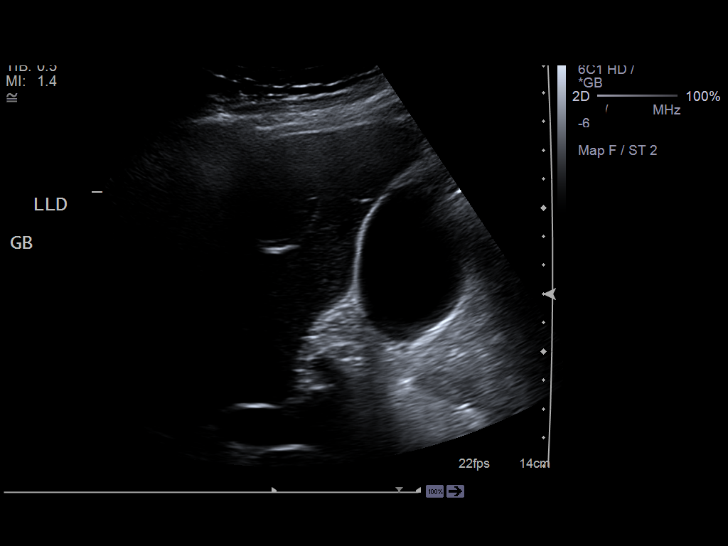
[im 19/22]
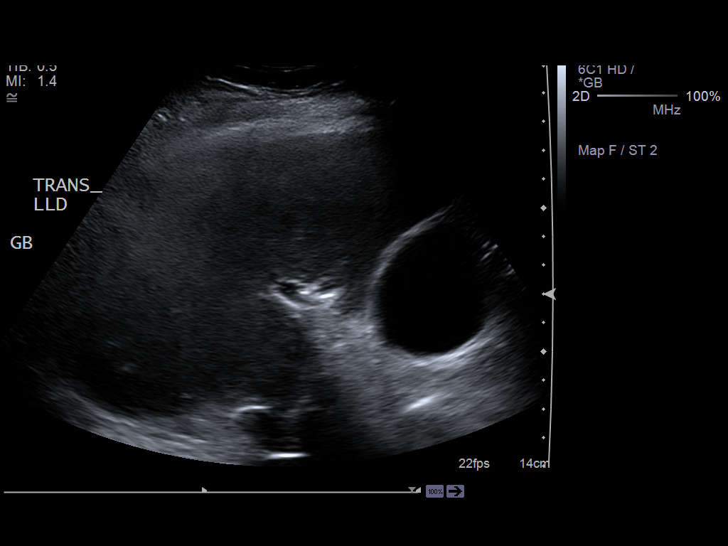
[im 20/22]
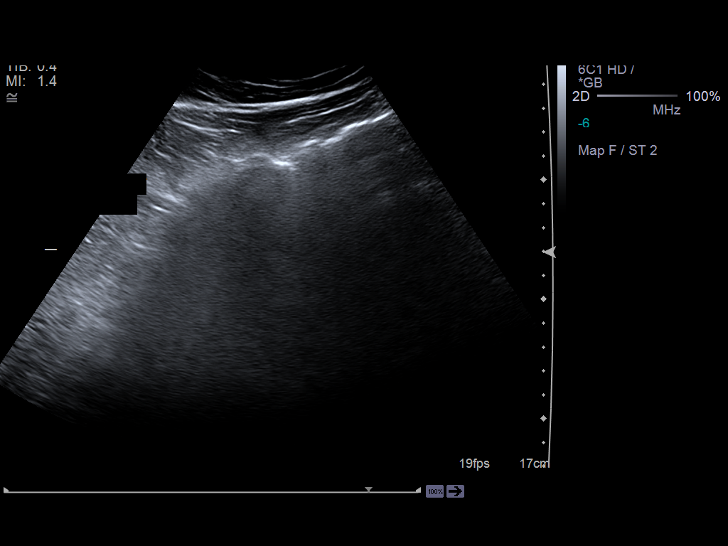
[im 22/22]
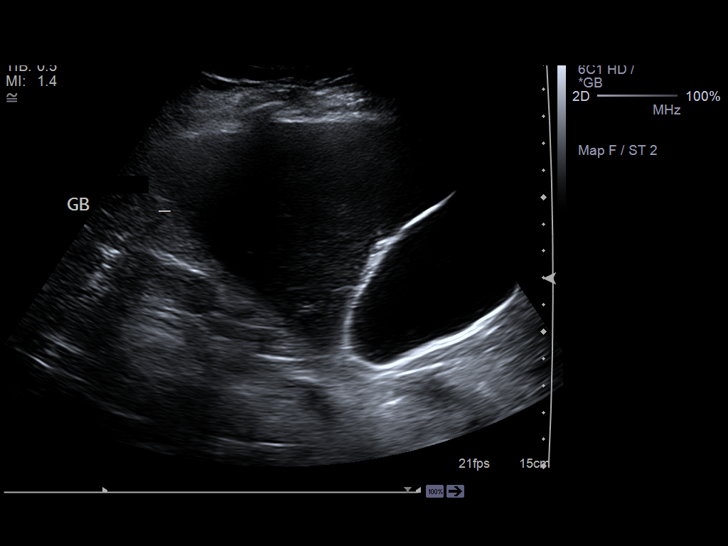

[14 of 22 positions shown; findings below may reference images not displayed]

FINDINGS: The liver demonstrates a homogeneous echotexture, visualized
portions. Gallbladder fossa demonstrates no evidence of pericholecystic
fluid, gallstones, sludging, sonographic Murphy's sign, gallbladder wall
thickening or biliary ductal dilatation. The gallbladder wall thickness is
2.1 mm and the common bile duct measures 4.8 mm in diameter. The pancreatic
head is not visualized. Hepatopetal flow is identified within the portal
vein.
IMPRESSION: 1. Unremarkable right upper quadrant abdominal ultrasound. There is no
sonographic evidence reflecting cholecystitis.
2. Dr. Vas San of the Emergency Department was informed of these findings via
a preliminary faxed report.

## 2013-12-22 IMAGING — CR DG CHEST 2V
1 series · 3 of 3 positions shown · non-contrast
Comparison: none

REASON FOR EXAM: dyspnea
COMMENTS:

PROCEDURE:     DXR - DXR CHEST PA (OR AP) AND LATERAL  - February 07, 2012  [DATE]
RESULT:

[Series 1: ap · 0.17mm/px · 3 of 3 slices shown]
[im 1/3]
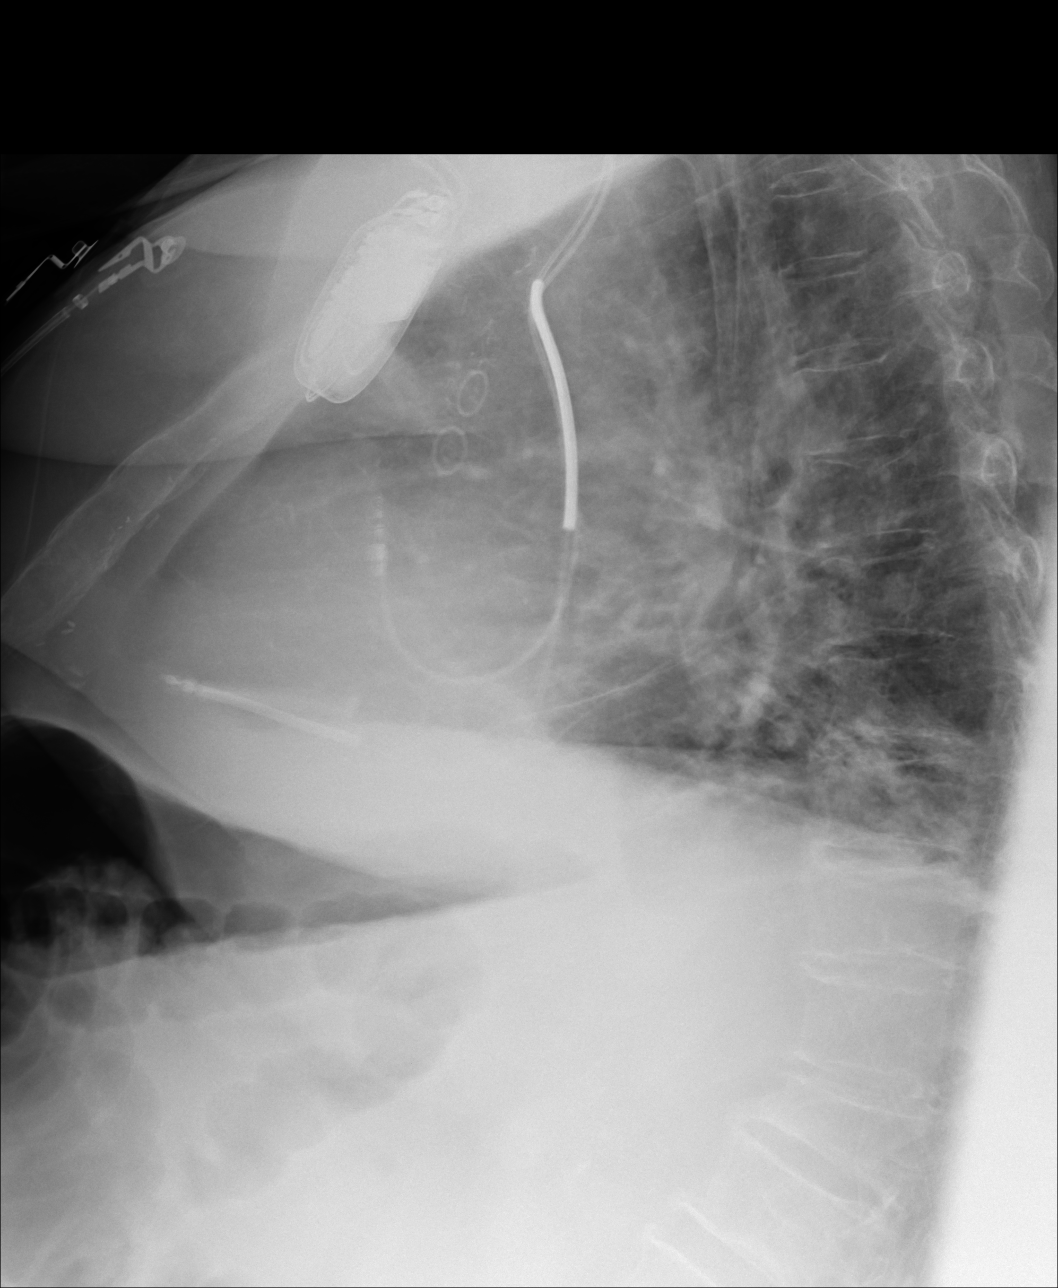
[im 2/3]
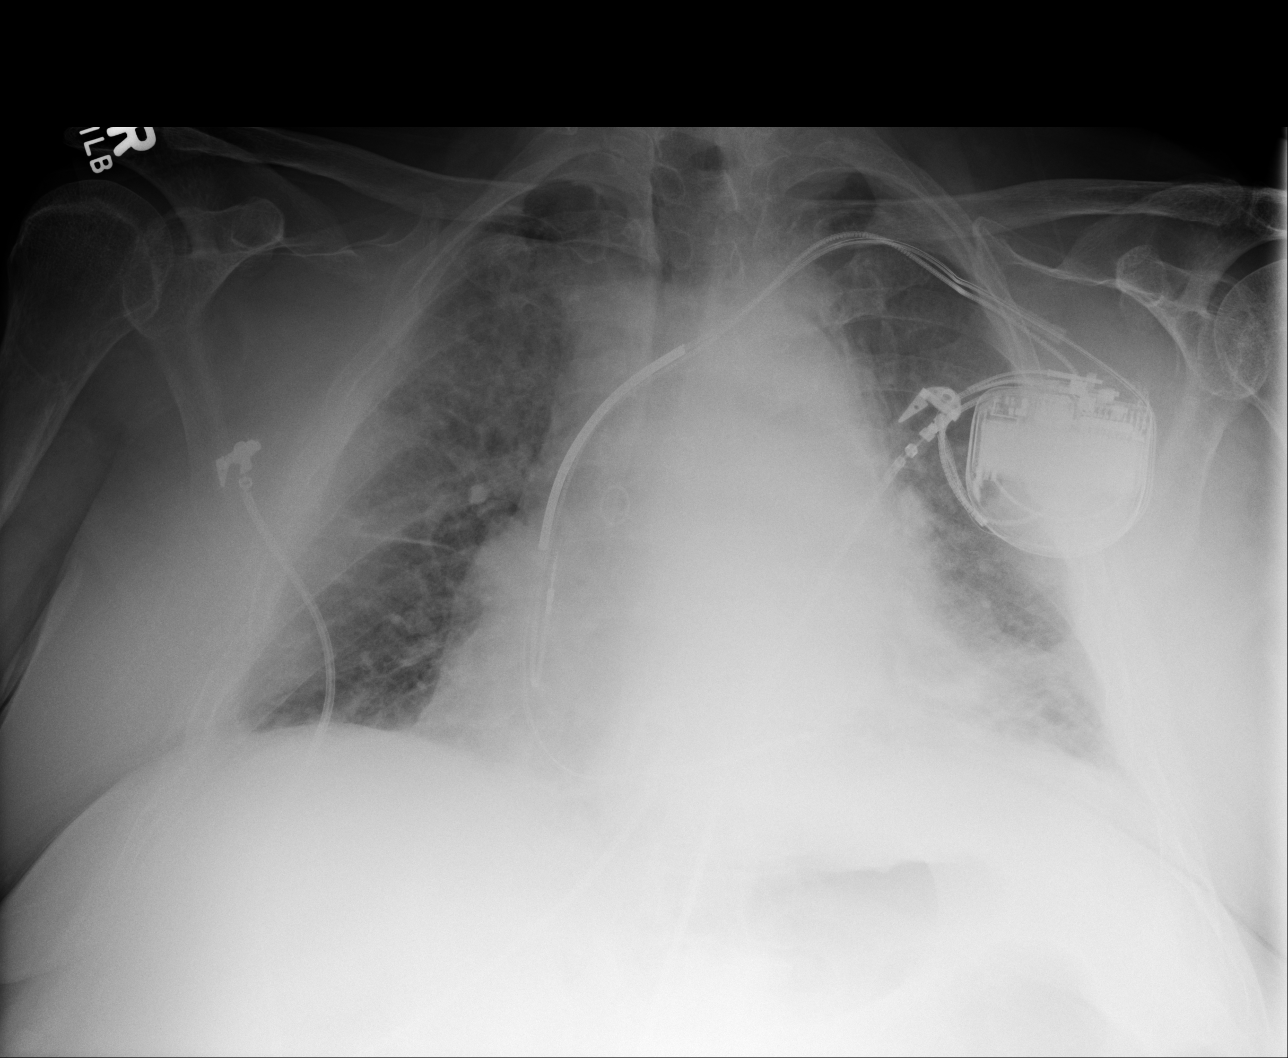
[im 3/3]
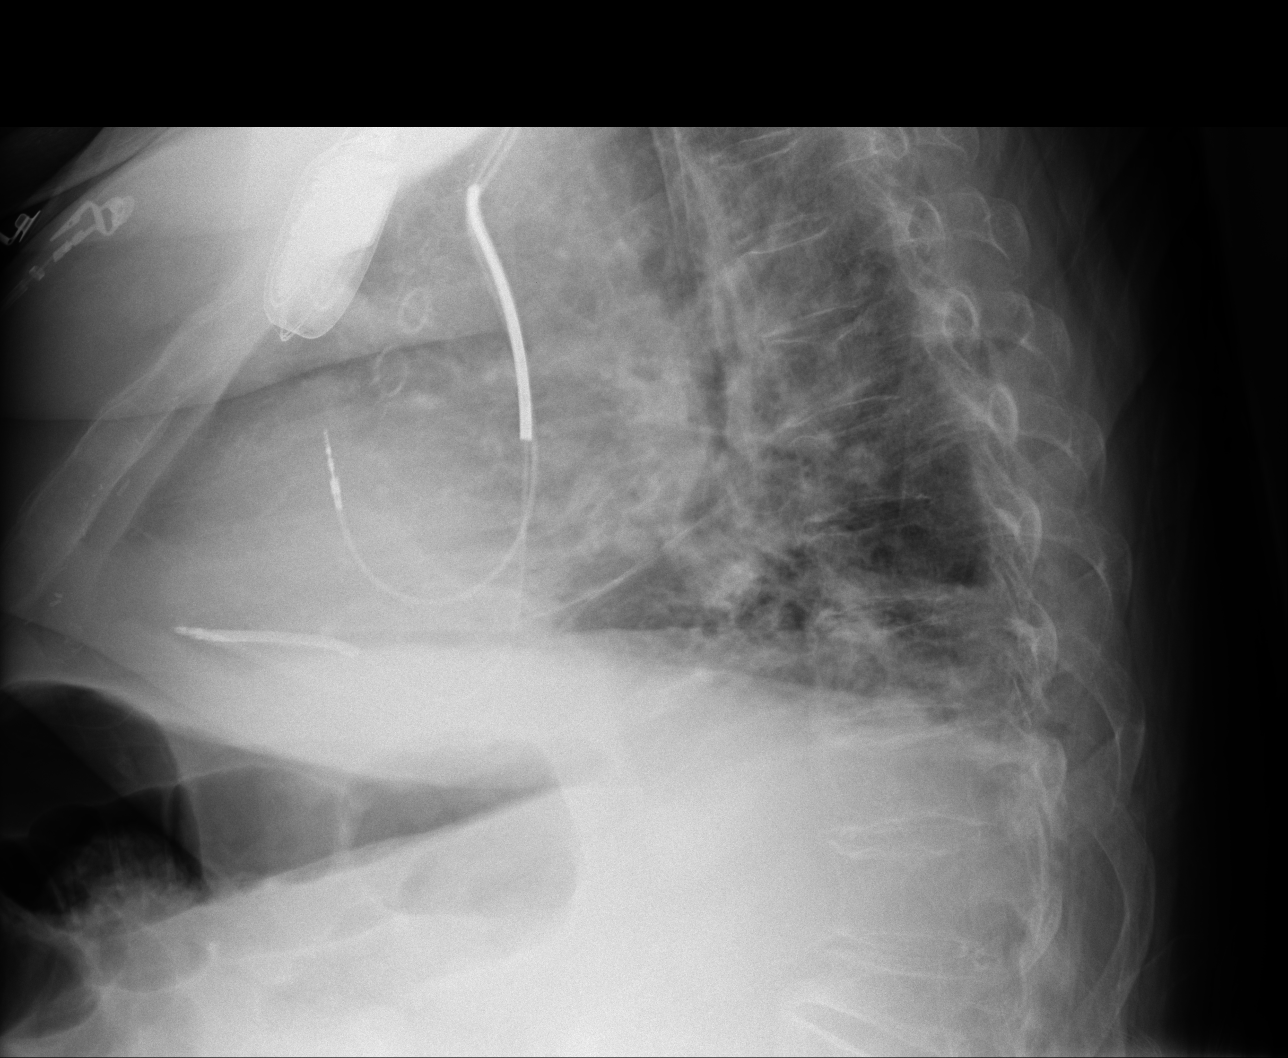

[3 of 3 positions shown; findings below may reference images not displayed]

FINDINGS: The patient has taken a shallow inspiration. There is thickening
of the interstitial markings and a component of peribronchial cuffing. An
area of increased density projects within the left lower lobe. The cardiac
silhouette is enlarged. A left sided pectoralis pacing unit is identified
with lead tips projecting in the region of the right atrium and right
ventricle. The bones are osteopenic. A wedged compression is appreciated
involving L1 which appears to be chronic. Note, this study was compared to a
study dated 06/30/2011.
IMPRESSION: 1.  Shallow inspiration.
2.  Findings which may represent a component of pulmonary vascular
congestion with underlying fibrotic changes.
3.  Cardiomegaly.

## 2014-11-09 NOTE — Consult Note (Signed)
Pt seen and examined. Multiple medical problems now with increasing back pain. Hx of AAA. LFT elevated though better today. Denies family hx, alcohol use, or recent changes meds. CHF under control according to patient. Sudden rise in LFT usually due to meds/toxins, ischemia/hypoxia, infection/sepsis, etc. Passive congestion can raise LFT. Will order various serologies. Moniter INR level over the weekend. As long as LFT does not sky rocket, nothing needs to be done urgently. Await CT angiography. Will check back on Monday. IF LFT worsens significantly, then contact GI on call. Thanks.  Electronic Signatures: Lutricia Feilh, Gehrig Patras (MD)  (Signed on 19-Jul-13 13:20)  Authored  Last Updated: 19-Jul-13 13:20 by Lutricia Feilh, Allexus Ovens (MD)

## 2014-11-09 NOTE — H&P (Signed)
PATIENT NAME:  Gary [name], Gary [name] MR#:  [id number] DATE OF BIRTH:  1941/06/07  DATE OF ADMISSION:  02/07/2012  HISTORY OF PRESENT ILLNESS: The patient is a 74 year old male who presents with intractable nausea, vomiting, weight loss, near syncope standing, with progressive hepatitis. The patient notes a one week history of poor appetite, very weak, basically cannot get around the house, with intractable nausea and vomiting. He saw our Clinic on Monday or Tuesday of this week noting slightly elevated liver function tests. He had a wound on his right abdomen which was very superficial, culture pending. Ultimately he went to the Emergency Department where abdominopelvic CT showed a 4.6 cm AAA which was a little larger than prior. LFTs were slightly further higher. Ultrasound of the gallbladder at that time was normal, not showing any cholecystitis. The patient has progressively declined with the vomiting, headache, significant, back with trouble walking and weakness. He will be admitted for further evaluation and treatment.   PAST MEDICAL HISTORY:  1. Cardiomyopathy, severe ischemic.  2. 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  3. Diabetes mellitus, insulin requiring.  4. Chronic atrial fibrillation on Coumadin.  5. Chronic renal insufficiency.  6. Sleep apnea.  7. Arteriosclerotic cardiovascular disease.    PAST SURGICAL HISTORY:  1. Coronary artery bypass graft x5.  2. Defibrillator and pacemaker in 2008.   ALLERGIES: None.   MEDICATIONS:  1. Humulin 70/30 insulin 20 units b.i.d.  2. 3 liters O2 nasal cannula.  3. Xanax 0.5 mg daily p.r.n.  4. Cardizem CD 180 mg daily.  5. Amiodarone 200 mg daily.  6. Prednisone 5 mg daily.  7. Lasix 80 mg b.i.d.  8. Ventolin HFA 2 puffs every 4 hours p.r.n.  9. Zoloft 100 mg daily.  10. Theophylline 2400 mg b.i.d.  11. Protonix 40 mg b.i.d.  12. Warfarin 1 mg, 2 tabs at bedtime.  13. Captopril  25 mg, 1 a.m., 1/2 p.m.  14. Carvedilol 12.5 mg b.i.d.   SOCIAL HISTORY: No current smoking. No alcohol. Fully disabled. He was on Hospice in the past. Family is attentive.    FAMILY HISTORY: Noncontributory.   REVIEW OF SYSTEMS: As noted above, no fever or chills.   PHYSICAL EXAMINATION:  VITAL SIGNS: Blood pressure 74/48, pulse oximetry 68% 3 liters, weight 227.   HEENT: Normal TMs. Normal oropharynx.   NECK: No clear JVD.   LUNGS: Hyperexpanded. No rales.   HEART: Irregular rhythm, a 2/6 systolic murmur throughout the precordium, significant laterally displaced PMI.   ABDOMEN: Morbidly obese, soft.   SKIN: Skin wound right upper abdomen, back, profound vertebral tenderness in the lower spine with paralumbar tenderness. No erythema.   EXTREMITIES: 2+ edema. Diminished peripheral pulses.   NEUROLOGIC: Slightly lethargic but easily answers questions and is overly alert.   LABORATORY, DIAGNOSTIC AND RADIOLOGICAL DATA:  Pending.   ASSESSMENT AND PLAN:  1. Hepatitis: Liver function tests 100 to 200s. No clear evidence for passive congestion. We will recheck labs. No clear evidence for cholecystitis either. We will get GIs input.  2. Hypotension: Reduced dose of carvedilol, hold captopril, hold theophylline as that could be contributing to the nausea. We will give Solu-Medrol x1 with stress in case this could be addisonian reaction. Follow closely for congestive heart failure.  3. Skin lesions: Surgical thoughts. Topical antibiotics.  4. Nausea and vomiting: Probably related to abnormal liver function tests and volume depletion. Continue b.i.d. Protonix.  5. Low back pain: He has never had this  before. With AAA slightly larger, we will get Vascular's opinion. We will treat with low-dose morphine. I do not want to use tramadol, certainly not Ativan. It could certainly be just acute lumbar disk disease, although there is no significant long tract signs. Could use steroids, if needed.   6. Overall prognosis is poor with his severe multiple medical problems.  I will not above 3 liters O2 if hypercapnic respiratory failure. Order written to reflect not going above 3 liters. We will use BiPAP at night   ____________________________ Danella PentonMark F. Kerilyn Cortner, MD mfm:cbb D: 02/07/2012 15:26:32 ET T: 02/07/2012 16:11:11 ET JOB#: [id number]  cc: Danella PentonMark F. Arely Tinner, MD, <Dictator> Lunell Robart Sherlene ShamsF Woodruff Skirvin MD ELECTRONICALLY SIGNED 02/10/2012 9:46

## 2014-11-09 NOTE — Discharge Summary (Signed)
PATIENT NAME:  Gary [name] MR#:  [id number] DATE OF BIRTH:  [birth year]  DATE OF ADMISSION:  02/07/2012 DATE OF DISCHARGE:  03/16/2012   DIAGNOSES AT TIME OF DISCHARGE:  1. Acute on chronic respiratory failure with difficulty in weaning patient off the ventilator.  2. Abdominal aortic aneurysm status post endovascular repair.  3. Congestive heart failure with ischemic cardiomyopathy.  4. Coronary artery disease status post coronary artery bypass graft.  5. 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  6. Type II diabetes.  7. Chronic atrial fibrillation.  8. Acute on chronic renal insufficiency.  9. Obstructive sleep apnea syndrome. 10. Abnormal liver function tests.   CHIEF COMPLAINT: Intractable nausea and vomiting, near syncope.   HISTORY OF PRESENT ILLNESS: Gary [name] is a 74 year old male who was initially admitted by Dr. Bethann Punches after he presented to his office complaining of intractable nausea, vomiting, weight loss, near syncope, poor appetite. The patient was noted to have abnormal liver function tests at that time. He subsequently went to the Emergency Room and had a CT scan of the abdomen which showed a 4.6 cm AAA.   PAST MEDICAL HISTORY:  1. Cardiomyopathy, severe, ischemic.  2. End-stage chronic obstructive lung disease.  3. Type II diabetes, insulin requiring. 4. Chronic atrial fibrillation.  5. Chronic renal insufficiency.  6. Sleep apnea.  7. Atherosclerotic cardiovascular disease.   PAST SURGICAL HISTORY:  1. Coronary artery bypass graft.  2. Defibrillator and pacemaker in 2008.  PHYSICAL EXAMINATION: The patient's blood pressure on admission was 74/48, pulse oximetry 68 on 3 liters, weight 227 pounds. LUNGS: Hyperexpanded. No crackles. HEART: S1, S2. 2/6 systolic ejection murmur. ABDOMEN: Morbidly obese, soft. EXTREMITIES: 2+ edema. NEUROLOGICAL: The patient was lethargic but was overall alert and answering  questions.  HOSPITAL COURSE: On 07/252013 the patient underwent endovascular repair of abdominal aortic aneurysm by Dr. Wyn Quaker and Dr. Gilda Crease. The patient subsequently had to be intubated and was hypotensive. His ACE inhibitors were held and was treated with dopamine to support his blood pressure. However, it was difficult to wean off the vent and continued to remain on the vent requiring high FiO2. The patient was also seen in consultation by Nephrology. His renal function did worsen and creatinine on July 31st was 1.75. The patient was initially placed on stress dose steroids and subsequently switched to IV Solu-Medrol. He was also started on IV Levaquin but continued to remain critically ill and remained intubated. He was seen in consultation also by Dr. Welton Flakes, pulmonologist. Chest x-ray did show some improvement in the pulmonary interstitium and some resolution of pulmonary edema. The patient also had abnormal liver function tests with SGOT going up to 145 and SGPT of 85. His WBC count also went up from 14.7 to 19.9. He received sliding scale coverage for elevated sugars. For his atrial fibrillation he was continued on amiodarone and diltiazem. Discussions were held with family and a Palliative Care consult was also ordered. It was felt that he would benefit from long-term ventilator unit and was transferred in stable but critical condition to LTAC in [place].   MEDICATIONS ON DISCHARGE:  1. Amiodarone 200 mg a day.  2. Levaquin injection 750 q. 48 hours. 3. Solu-Medrol 20 mg IV q.8. 4. Diltiazem 30 mg q.8. 5. Aspirin 81 mg a day.  6. Furosemide injection 40 mg daily.  7. Pantoprazole 40 mg q.6 a.m.  8. Zofran 4 mg q.4 p.r.n.  9. Morphine 1 to 2 mg p.o. q. hour p.r.n.  for pain. 10. Ascorbic acid 2000 mg a day.  11. Alprazolam 0.25 mg q.8 p.r.n. for anxiety. 12  Dopamine infsion for hypotension    The patient was also started on tube feedings with help of dietary, however, overall prognosis  appeared to be poor. Family was made aware of condition and agreed with plan.   ____________________________ Barbette ReichmannVishwanath Denia Mcvicar, MD vh:drc D: 02/20/2012 13:06:30 ET T: 02/28/2012 14:38:06 ET JOB#: [id number]  cc: Barbette ReichmannVishwanath Beckhem Isadore, MD, <Dictator> Barbette ReichmannVISHWANATH Cleophas Yoak MD ELECTRONICALLY SIGNED 02/27/2012 17:10

## 2014-11-09 NOTE — Consult Note (Signed)
Patient is a morbidly obese WM with one week history of low back pain.  He has severe ischemic cardiomyopathy and oxygen dependent COPD.  His AAA on an uninfused study from earlier this week is about 5.1-5.2 cm in maximal diameter.  He appears to have a long infrarenal neck that would be allow stent graft placement and iliacs are not involved.  His AAA is not easy to palpate due to the size, but no real tenderness is appreciated.  Will give mucomyst, vitamin C, and bicarb protocol due to renal insufficiency and obtain a CT angiogram tomorrow am.  I think it is not clear that the AAA is the cause of the pain, and low back/lumbosacral disease may be the cause, but can not exclude that and if this is a symptomatic aneurysm the risk of rupture is high.  Difficult situation due to the patient's extensive medical history as well as he is not a candidate for open repair, and will need medical and cardiac optimization even for stent graft repair.  Will defer work-up of lumbosacral spine disease to Dr. Hyacinth MeekerMiller or other consultants, but will further evaluate the AAA for consideration for repair.    Electronic Signatures: Annice Needyew, Leonor Darnell S (MD)  (Signed on 18-Jul-13 17:03)  Authored  Last Updated: 18-Jul-13 17:03 by Annice Needyew, Aiyana Stegmann S (MD)

## 2014-11-09 NOTE — Consult Note (Signed)
PATIENT NAME:  Gary [name], VERT MR#:  [id number] DATE OF BIRTH:  [birth year]  DATE OF CONSULTATION:  02/08/2012  CONSULTING PHYSICIAN:  Ezzard Standing. Korry Dalgleish, MD  REASON FOR REFERRAL: Abnormal liver enzymes.   DESCRIPTION: The patient is a 74 year old white male with multiple medical history including cardiac, pulmonary and renal disease. He was admitted because of increasing nausea, vomiting and weight loss. His main symptoms are he also complained of decreased appetite. He is also complaining of increasing back pain over the past week or two which is severe. He does have a known history of AAA which measures 4.6. Ultrasound of the abdomen showed normal gallbladder without any evidence of cholecystitis. I was consulted because of increasing liver enzymes. According to the patient, there is no prior history of hepatitis or abnormal liver enzymes. There is no family history of liver disease. The patient denies any alcohol use. He denies any recent medication changes, although he is on many medications already. There have been a few times where he was hypotensive, especially when he got admitted. There is no fevers or chills.   OTHER PAST MEDICAL HISTORY:  1. Severe cardiomyopathy. 2. End-stage chronic obstructive pulmonary disease. 3. Diabetes. 4. Chronic atrial fibrillation, on Coumadin. 5. Chronic renal insufficiency.  6. Sleep apnea.   PAST SURGICAL HISTORY:  1. Pacemaker and defibrillator  placement.  2. Coronary artery bypass surgery.   ALLERGIES: He has no allergies to medication.   MEDICATIONS: Insulin, Xanax, Cardizem, prednisone, amiodarone, Lasix, inhalers, Zoloft, theophylline, Protonix, Coumadin, captopril, carvedilol.   SOCIAL HISTORY: There is no smoking or alcohol.  REVIEW OF SYSTEMS: There no fevers or chills. There is some weight loss. He does complain of increasing weakness and fatigue. This is no chest pain or palpitations. There is no shortness of breath or coughing. GI symptoms have  been described already. The patient denies any significant abdominal pain or diarrhea, but he does have some nausea and vomiting.   PHYSICAL EXAMINATION:  GENERAL: The patient is in some distress. He was hypotensive when he got admitted with blood pressure 74/48. Pulse oximetry is only 68 percent on 3 liters. Today his blood pressure is more stable.   HEENT: Normocephalic, atraumatic head. Pupils are equally reactive.   LUNGS: Decreased breath sounds but no crackles or rhonchi. Heart examination irregularly irregular with a systolic murmur.   ABDOMEN: Obese, but he was soft. I did not appreciate any tenderness. He did talk about some severe right upper quadrant pain many years ago, but he does not have them anymore.   EXTREMITIES: 2+ edema.   NEUROLOGIC: The patient was somnolent this morning when I saw him, but he was awake and was able to converse fairly well.   LABORATORY, DIAGNOSTIC, AND RADIOLOGICAL DATA: Yesterday, his bilirubin was normal. Alkaline phosphatase 158, AST 486 and ALT 579. Today, the liver enzymes are somewhat better. Alkaline phosphatase is high at 176, but bilirubin remains at 0.7, AST 397, ALT 554, sodium 137, potassium 3.9, chloride 96, CO2 30, BUN 47, creatinine 1.95. White count is 10.7, hemoglobin 13.7, INR is 2.2. Ultrasound done on 07/17 was negative.   ASSESSMENT AND PLAN: This is a patient with multiple medical history who complains of increasing back pain. With his history of AAA, we have to be concerned about his AAA getting worse. The patient has CT angiography pending  once creatinine level improves. His liver enzymes are improving today compared to yesterday. Liver enzymes previously had been negative. A sudden rise in liver enzymes is  usually due to medications or toxins, ischemia/hypoxia, infection/sepsis, et Karie Sodacetera. Passive congestion from cerebrospinal fluid can also raise her liver enzymes to go up. We will order various serologies over the weekend. We need  to monitor the INR to make sure it is not getting any worse. As long as the liver enzymes do not skyrocket, there is nothing needs to be done urgently. We will wait for the CT angiography and check back on Monday. If liver enzymes worsen significantly over the weekend, then GI on call need to be contacted.   Thank you for the referral.   ____________________________ Ezzard StandingPaul Y. Bluford Kaufmannh, MD pyo:cbb D: 02/08/2012 16:33:40 ET T: 02/08/2012 18:21:43 ET JOB#: [id number]  cc: Ezzard StandingPaul Y. Bluford Kaufmannh, MD, <Dictator> Ezzard StandingPAUL Y Percy Winterrowd MD ELECTRONICALLY SIGNED 02/14/2012 10:55

## 2014-11-09 NOTE — Op Note (Signed)
PATIENT NAME:  Gary [name], Gary A MR#:  [id number] DATE OF BIRTH:  03-18-1941  DATE OF PROCEDURE:  02/14/2012  PREOPERATIVE DIAGNOSIS: Asymptomatic abdominal aortic aneurysm.   POSTOPERATIVE DIAGNOSIS: Symptomatic abdominal aortic aneurysm.   PROCEDURES PERFORMED:  1. Endovascular repair of abdominal aortic aneurysm with Gore excluder endoprosthesis.  2. Bilateral open femoral artery cutdowns.  3. Introduction catheter into aorta.   PROCEDURE PERFORMED BY: Dr. Dew/Co-Surgeon: Renford DillsGregory G. Keandria Berrocal, MD   ANESTHESIA: General by endotracheal intubation.   FLUIDS: Per anesthesia record.   ESTIMATED BLOOD LOSS: 75 mL.   CONTRAST USED: Isovue 60 mL.   FLUORO TIME: 6.5 minutes.   INDICATIONS: Gary [name] is a 74 year old gentleman with several medical problems who presented with increasing pain in the back and abdomen. Etiologies have been slowly narrowed down and the remaining most likely etiology appears to be his abdominal aortic aneurysm. Given its symptomatic nature and the increased risk for rupture, discussion with the family regarding abdominal aortic aneurysm repair with a stent graft was undertaken. Extensive discussion given the patient's pulmonary status and the possible need for tracheostomy was also reviewed. All questions have been answered. Both the patient and family are in agreement with proceeding.   PROCEDURE: The patient is taken to Special Procedures and placed in the supine position. After adequate general anesthesia is induced and appropriate invasive monitors have been placed, his abdomen is prepped and draped in a sterile fashion.   With myself working on the left side and Dr. Wyn Quakerew working on the right side, vertical incisions are created overlying the femoral impulses and the dissection is carried down through the soft tissue to expose the femoral artery. Common femoral arteries are then looped proximally and distally. 5000 units of heparin is given. Again working  simultaneously, Seldinger needles are inserted into the anterior wall of the common femoral artery and J-wires are then advanced. 8 French sheaths are placed. Pigtail catheter is advanced up the left side, KMP catheter up the right. Bolus injection of contrast is then used to create an image of the abdominal aorta in the AP projection. Renal arteries are localized and length measurements are made.   The 18 French dry seal sheath is then advanced up the left side after Amplatz Super Stiff wire is exchanged through the pigtail catheter and subsequently a main body 23 x 12 x 14 device is advanced, opened, prepped on the field, and advanced up the left. KMP catheter is then repositioned at the level of the lower renal artery which is the left and a secondary injection is obtained to verify the location of the renal. The main body device is then opened. Follow-up angiography is then obtained which demonstrates the device is lower than it should be and, therefore, is re-constrained, advanced 1 cm approximately and then reopened. The contralateral limb is then engaged by Dr. Wyn Quakerew with the KMP catheter, guidewire verified with the pigtail catheter. Follow-up angiography demonstrates excellent position in the main body and after retrograde image through the right is obtained, 12 French sheath is advanced and a 12 x 12 contralateral limb is opened, prepped, and then deployed.   Coda balloon is then advanced up both sides and used to angioplasty all seal zones. Pigtail catheter is advanced up the right and angiography is obtained which does not demonstrate any evidence of type I, type II, or type III endoleak.   Both sheaths are removed. Hemodynamics are maintained and, therefore, the wires are removed. Both femoral arteriotomies are then closed with  interrupted 6-0 Prolene and Evicyl is placed. Both groin incisions are then closed with multiple layers of 2-0 as well as 3-0 Vicryl and the skin is closed with 4-0 Monocryl  subcuticular and then Dermabond. The patient tolerated the procedure without hemodynamic instability and is taken to the recovery room in stable condition. He remains intubated.   ____________________________ Renford Dills, MD ggs:drc D: 02/16/2012 13:51:00 ET T: 02/16/2012 14:12:12 ET JOB#: [id number]  cc: Renford Dills, MD, <Dictator>, Lamar Blinks, MD, Danella Penton, MD, Yevonne Pax, MD Renford Dills MD ELECTRONICALLY SIGNED 02/16/2012 17:20

## 2014-11-09 NOTE — Consult Note (Signed)
General Aspect the patient is a 74 year old male with multiple medical problems including coronary artery disease status post subendocardial myocardial infarction in 2004 with coronary artery bypass grafting done in 2004 with placement of a left internal mammary to the LAD, saphenous vein graft to the first diagonal, first OM and second OM as well as an acute marginal. Postop course was complicated by mediastinitis requiring muscle flap procedures. He also had postoperative atrial fibrillation and ventricular tachycardia. He is status post AICD placement in 2008 by Dr. Graciela HusbandsKlein. He has a history of diabetes, renal insufficiency, dilated cardiomyopathy. He is now admitted with back pain and intractable nausea and vomiting. He has been noted to have evidence of a enlarging abdominal aortic aneurysm. Concern over possible leakage of the aneurysm causing pain has been raised. He is being considered for abdominal aortic aneurysm stenting in the next couple of days. He is currently hemodynamically stable. Based on his prior medical condition and comorbid issues, patient would be considered high risk for surgery from a cardiovascular standpoint.   Physical Exam:   GEN obese, disheveled    HEENT PERRL, hearing intact to voice    NECK supple    RESP no use of accessory muscles  rhonchi  crackles    CARD Irregular rate and rhythm  Murmur    Murmur Systolic    Systolic Murmur axilla    ABD positive tenderness  normal BS    LYMPH negative neck    EXTR negative cyanosis/clubbing, positive edema    SKIN positive rashes    NEURO cranial nerves intact, motor/sensory function intact    PSYCH A+O to time, place, person   Review of Systems:   Subjective/Chief Complaint back pain and abdominal pain, shortness of breath, fatigue    General: Fatigue    Skin: Rashes    ENT: No Complaints    Eyes: No Complaints    Neck: No Complaints    Respiratory: Short of breath    Cardiovascular: No  Complaints    Gastrointestinal: No Complaints    Genitourinary: No Complaints    Vascular: No Complaints    Musculoskeletal: back pain    Neurologic: No Complaints    Hematologic: No Complaints    Endocrine: No Complaints    Psychiatric: No Complaints    Review of Systems: All other systems were reviewed and found to be negative    Medications/Allergies Reviewed Medications/Allergies reviewed     Multi-drug Resistant Organism (MDRO): Positive culture for MRSA., 29-Jul-2010   DVT:    sternotomy infection:    Renal Insufficiency:    Sleep Apnea:    Atrial Fibrillation:    Tobacco Use:    high cholesterol:    chf:    copd:    CAD:    HTN:    Diabetes Mellitus, Type II (NIDD):    AICD:    muscle flap procedure:    CABG (Coronary Artery Bypass Graft):    breast bone removed:   Home Medications: Medication Instructions Status  acetaminophen-oxycodone 325 mg-5 mg oral tablet 1  orally every 4-6 hours as needed for pain Drowsiness precauitons. Active  Coumadin 1 mg oral tablet 2 mg mon through Friday, 2.5 mg sat and sun Active  captopril 25 mg oral tablet one tab in the morning, 1/2 tab in the evening Active  furosemide 80 mg oral tablet 1 tab(s) orally 2 times a day Active  Humulin 70/30 subcutaneous suspension 20 units  subcutaneous 2 times a day  Active  diltiazem 180 mg/24 hours oral capsule, extended release 1 cap(s) orally once a day Active  pantoprazole 40 mg oral delayed release tablet 1 tab(s) orally 2 times a day Active  Bipap 12/5  Active  Oxygen 3L per nasal cannula continuous  Active  sertraline 100 mg oral tablet 1 tab(s) orally once a day Active  theophylline 100 mg oral tablet, extended release 1 tab(s) orally 2 times a day Active  potassium chloride 10 mEq oral capsule, extended release 1 cap(s) orally once a day Active  carvedilol 12.5 mg oral tablet 1 tab(s) orally 2 times a day Active  amiodarone 200 mg oral tablet 1 tab(s) orally  once a day Active  Xanax 0.25 mg oral tablet 1 tab(s) orally once a day, As Needed- for Anxiety, Nervousness  Active  predniSONE 5 mg oral tablet 1 tab(s) orally once a day Active   EKG:   Abnormal NSSTTW changes  ICVD    Advair Diskus: Resp. Distress  Spiriva: Resp. Distress  Lipitor: Pain    Impression 74 year old male with history of coronary artery disease status post coronary artery bypass grafting with a complicated postoperative course requiring defibrillator as well as treatment for atrial fibrillation and mediastinitis. He also has a history of severe COPD, dilated cardiomyopathy, renal insufficiency and diabetes mellitus. He has had multiple admissions for heart failure and renal insufficiency and is now admitted with back pain, intractable nausea and vomiting as well as elevated liver enzymes. He was noted to have a growing abdominal aortic aneurysm which is felt to be symptomatic at present. Consideration for infrarenal aortic grafting his being considered. Patient would appear to be high risk from a cardiac and pulmonary status for this procedure however should it be necessary based on the size of the aneurysm, would attempt to keep the patient on beta blockers during the perioperative period.    Plan 1. Continue with current medications. If intervention is pending, warfarin will need to be held throughout the procedure and resumed as soon as possible postop 2. Continue with beta blockers throughout the perioperative period 3. We'll discuss with the patient's primary cardiologist regarding further workup regarding preoperative risk assessment however patient appears to be optimized as much as possible given his comorbid conditions 4. Careful diuresis 5. Agree with pulmonary evaluation prior to intervention as well.   Electronic Signatures: Dalia Heading (MD)  (Signed 21-Jul-13 13:57)  Authored: General Aspect/Present Illness, History and Physical Exam, Review of System, Past  Medical History, Home Medications, EKG , Allergies, Impression/Plan   Last Updated: 21-Jul-13 13:57 by Dalia Heading (MD)

## 2014-11-09 NOTE — Op Note (Signed)
PATIENT NAME:  Gary [name], Brogen A MR#:  [id number] DATE OF BIRTH:  [birth year]  DATE OF PROCEDURE:  02/14/2012  PREOPERATIVE DIAGNOSES:  1. Symptomatic abdominal aortic aneurysm approximately 5 cm.  2. Severe chronic obstructive pulmonary disease on continuous oxygen.  3. Congestive heart failure with ejection fraction of 20%.  4. Chronic kidney disease.   POSTOPERATIVE DIAGNOSES:  1. Symptomatic abdominal aortic aneurysm approximately 5 cm.  2. Severe chronic obstructive pulmonary disease on continuous oxygen.  3. Congestive heart failure with ejection fraction of 20%.  4. Chronic kidney disease.   PROCEDURE: 1. Bilateral femoral artery cutdowns for placement of endoprosthesis, right by Dr. Wyn Quakerew, left by Dr. Gilda CreaseSchnier.  2. Catheter placement into aorta from bilateral femoral approaches, right by Dr. Wyn Quakerew, left by Dr. Gilda CreaseSchnier.   3. Placement Gore Excluder endoprosthesis, 23 mm in diameter proximal, 12 mm in diameter distal,  primary left, with a 12-mm diameter x 12-cm length contralateral leg on the right.  CO- SURGEONS:  Festus BarrenJason Dew, M.D. and Levora DredgeGregory Schnier, M.D.   ANESTHESIA: General.   ESTIMATED BLOOD LOSS: Approximately 75 mL.   INDICATION FOR PROCEDURE: 74 year old male with severe extensive medical comorbidities who was admitted with back pain. He has an aneurysm which has significantly enlarged over the last couple of years and now is roughly 5 cm in maximal diameter. As this is likely a symptomatic aneurysm, the likelihood of rupture is high and repair is indicated. He has an extensive list of medical comorbidities and he and his family were told he was an extremely high-risk patient for surgery, but as this was likely a symptomatic aneurysm it was felt this needed to be done despite his comorbidities and despite the risks, as he would obviously not survive a rupture.  They wished strongly to proceed.   DESCRIPTION OF PROCEDURE: The patient was brought to vascular intervention radiology  suite. With the help of our anesthesia colleagues providing a general anesthetic, his groins and abdomen were sterilely prepped and draped and a sterile surgical field was created. We performed vertical incisions overlying the femoral artery; I dissected out the right and Dr. Gilda CreaseSchnier dissected out the left. His right femoral artery was more heavily diseased than the left, and we elected to place the primary device on the left for that reason. The patient was systemically heparinized. 8 French sheaths were placed bilaterally and pigtail catheter was placed from the left and AP aortogram was performed. We then placed an 18 French sheath over an Amplatz Super Stiff wire. I placed a Kumpe catheter from the right and a magnified view of the renals was performed. Dr. Gilda CreaseSchnier deployed the initial portion of the main body just below the renal arteries. I cannulated the contralateral gate without difficulty with a Kumpe catheter and a Glidewire and confirmed successful cannulation by twirling the pigtail catheter in the main body. Magnified image was then performed and we re-constrained the device and inched this a few millimeters higher to just below the left renal artery, which was lower. I placed a 12- mm diameter x 12- cm length contralateral limb from the right to just above the hypogastric artery. Dr. Gilda CreaseSchnier completed the deployment on the left just above the hypogastric artery. A compliant balloon was used to iron out all junction points and seal zones. Completion angiogram was performed. This showed excellent flow through the stent graft with patent renal arteries and hypogastric arteries and at this point I elected to terminate the procedure. Both sheaths were removed. The femoral arteries  were closed with interrupted Prolene suture. The groins were closed with layered 2-0 Vicryl. 3-0 Vicryl was used to close the subcutaneous tissue and 4-0 Monocryl was used to close the skin. Dermabond was placed as a dressing.  The patient was then awakened from anesthesia and taken to the recovery room in stable condition having tolerated the procedure well.    ____________________________ Annice Needy, MD jsd:bjt D: 02/14/2012 17:34:58 ET T: 02/15/2012 09:27:07 ET JOB#: [id number]  cc: Annice Needy, MD, <Dictator> Barbette Reichmann, MD Lamar Blinks, MD Herbon E. Meredeth Ide, MD Annice Needy MD ELECTRONICALLY SIGNED 02/18/2012 8:46

## 2014-11-09 NOTE — Consult Note (Signed)
PATIENT NAME:  Gary Gary [name], Gary Gary [name] MR#:  [id number] DATE OF BIRTH:  1941/01/20  DATE OF CONSULTATION:  02/07/2012  REFERRING PHYSICIAN:   CONSULTING PHYSICIAN:  Annice NeedyJason S. Dew, MD  REASON FOR CONSULTATION: Greater than 5 cm abdominal aortic aneurysm with back pain.   HISTORY OF PRESENT ILLNESS: This is Gary [name] 74 year old white male with Gary [name] large volume of medical comorbidities including oxygen-dependent chronic obstructive pulmonary disease, severe ischemic cardiomyopathy. He was actually in hospice last year, but his condition stabilized to the point he was not immediately terminal and is now not in hospice services and had been going quite well per he and his family for several months. He is admitted with about Gary [name] one week history of back pain. This is severe and disabling. This is not usually Gary [name] real chronic condition for him, and there was concern this could be related to his known abdominal aortic aneurysm. His previous vascular surgeon has left the medical community.  In looking back at his records, his aneurysm was Gary [name] smaller aneurysm but has grown significantly and is now being 5.1 to 5.2 cm in maximal diameter. I have reviewed his uninfused CT scan from earlier this week independently. There is Gary [name] long infrarenal neck that would allow stent graft placement, the iliacs are not involved.   PAST MEDICAL HISTORY: 1. Cardiomyopathy, severe and ischemic.  2. 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  3. Diabetes mellitus.  4. Atrial fibrillation on Coumadin.  5. Chronic renal insufficiency.  6. Sleep apnea.  PAST SURGICAL HISTORY: 1. Coronary bypass x5.  2. Pacemaker defibrillator in 2008.   ALLERGIES: None.   MEDICATIONS: 1. Humulin 70/30 20 b.i.d.  2. Three liters of oxygen continuously.  3. Xanax 0.5 mg as needed.  4. Cardizem CD 180 mg daily.  5. Amiodarone 200 mg daily.  6. Prednisone 5 mg daily.  7. Lasix 80 mg b.i.d.   8. Ventolin inhalers.  9. Zoloft 100 mg daily.  10. Theophylline 2400 mg b.i.d. 11. Protonix 40 mg b.i.d.  12. Warfarin 1 mg 2 tablets at bedtime. 13. Captopril 25 in the morning, 12.5 at night.  14. Coreg 12.5 mg b.i.d.   SOCIAL HISTORY: Long history of tobacco use, not currently smoking. No alcohol. He is disabled. Family is very attentive.   FAMILY HISTORY: Noncontributory to present problem.  REVIEW OF SYSTEMS: GENERAL: No fevers or chills. No intentional weight loss or gain. EYES: No blurred or double vision. EARS: No tinnitus or ear pain. CARDIAC: Positive for severe coronary disease, ischemic cardiomyopathy. RESPIRATORY: Worse shortness of breath and is typical which is quite severe. GASTROINTESTINAL: Some abdominal discomfort and flank discomfort. GENITOURINARY: Chronic kidney disease. ENDOCRINE: No heat or cold intolerance. PSYCH: No anxiety or depression. NEUROLOGIC: No TIAs stroke, or seizure. SKIN: No new rash or ulcers. MUSCULOSKELETAL: Positive for low back pain.   PHYSICAL EXAMINATION: GENERAL: This is Gary [name] morbidly obese white male who is lying in bed on continuous oxygen.   VITAL SIGNS: Temperature of 97.6, pulse is 71, blood pressure is 122/68.   HEAD: Normocephalic and atraumatic.   EYES: Sclerae are anicteric. Conjunctivae are clear.   EARS: Normal external appearance. Hearing appears diminished.   NECK: Supple without adenopathy or jugular venous distention.   HEART: Irregularly irregular with Gary [name] systolic murmur.   LUNGS: Distant with wheezes bilaterally.   ABDOMEN: Soft, nondistended. There is not clear tenderness to palpation. He is quite obese.   EXTREMITIES: Warm and well perfused. He has  mild to moderate lower extremity edema. His pedal pulses are not easily palpable, but he has good capillary refill.   NEUROLOGIC: Normal strength and tone in all four extremities.   PSYCH: Difficult to assess affect and mood, family provides much of the history. He is not  Gary [name] great historian.  LYMPHATICS: No inguinal or axillary adenopathy.  LABORATORY, DIAGNOSTIC, AND RADIOLOGICAL DATA: Reveals sodium 139, potassium 3.7 chloride is 97, CO2 is 35, BUN is 49, creatinine is 2.24, glucose is 167. White blood cell count is 10.7, hemoglobin is 13.7, platelet count is 184,000. INR is 2.2.   ASSESSMENT AND PLAN: This is Gary [name] 74 year old white male with low back pain and now Gary [name] greater than 5 cm abdominal aortic aneurysm. Although the official report was originally read out as 4.6 cm, I have looked at the CT scan with Dr. Elijah Birk Register and this aneurysm is clearly greater than 5 cm, and I would estimate it to be between 5.1 and 5.2 cm in maximal diameter. He will need Gary [name] CT angiogram to evaluate if he is Gary [name] stent graft candidate. We will have to premedicate him with bicarbonate, Mucomyst and vitamin C, and would do this in the next couple of days as his creatinine is well above his baseline currently. He is an extremely high risk surgical candidate, but as this is Gary [name] symptomatic aneurysm, this represents Gary [name] very high risk of rupture, which would clearly be fatal. This Gary [name] very difficult and complex situation that has life-threatening consequences. I discussed with family the gravity of the situation, and they seemed to voice their understanding. We will try to obtain Gary [name] CT angiogram in the next day or two for      further evaluation, and if this is felt to be Gary [name] symptomatic aneurysm it will need to be repaired during this hospitalization. This is Gary [name] level-5 consultation.    ____________________________ Annice Needy, MD jsd:kma D: 03/06/2012 10:26:07 ET T: 03/06/2012 14:51:00 ET JOB#: [id number]  cc: Annice Needy, MD, <Dictator> Annice Needy MD ELECTRONICALLY SIGNED 03/06/2012 16:44

## 2014-11-09 NOTE — Consult Note (Signed)
PATIENT NAME:  Gary [name], [name] MR#:  [id number] DATE OF BIRTH:  [birth year]  DATE OF CONSULTATION:  02/07/2012  REFERRING PHYSICIAN:   CONSULTING PHYSICIAN:  Adella HareJ. Wilton Caitland Porchia, MD  CHIEF COMPLAINT: Regarding this consultation is a wound of the skin of the right upper abdomen.   HISTORY OF PRESENT ILLNESS: The patient reports that he noticed this wound about three weeks ago. He had had no trauma, it just appeared one day. He had had no wound at that site in the past and has been applying some salve and also a Band-Aid. He reports that a culture was taken around the time of admission although I do not see a culture [facility]. The patient reports no significant pain at the site.   PAST MEDICAL HISTORY: Also noted that the reason he has been admitted to the hospital includes nausea and vomiting and possibility of hepatitis.   He does have history of cardiomyopathy, chronic obstructive pulmonary disease, diabetes, atrial ventilation, chronic renal insufficiency, sleep apnea, and arteriosclerotic cardiovascular disease.   MEDICATIONS: Insulin, Xanax, Cardizem, amiodarone, prednisone, Lasix, Ventolin inhaler, Zoloft, theophylline, Protonix, warfarin, captopril, carvedilol.   PHYSICAL EXAMINATION:  GENERAL: He was awake, alert, and oriented.   VITAL SIGNS: Temperature 97.6, pulse 71, respirations 20, blood pressure 122/68.   ABDOMEN: Rotund. Band-Aid was removed from the right upper quadrant demonstrating two small sores. The one appears to be approximately 1.5 cm in dimension with a dry black eschar with underlying purulent material and also just lateral to that there is a somewhat smaller black eschar, which was approximately 6 mm in dimension. There was no surrounding erythema.   Both of these wounds were debrided sharply with scissors removing the two black eschars and also with the larger wound some underlying necrotic subcutaneous tissue and this exposed some granulation tissue  in the larger wound and just some subcutaneous fat and scar tissue in the smaller wound.   IMPRESSION: Two abdominal wall abscesses which appear suspicious for MRSA abscesses.   The patient says that a culture has been done so I did not repeat a culture.  I recommended daily dressing changes. I see that the nurses are already applying Bactroban which I think that will be reasonable to continue. Otherwise does not particularly appear to need systemic antibiotics for this.   If a culture was done and demonstrates MRSA, could consider Hibiclens bathing over the next few weeks.   ____________________________ Shela CommonsJ. Renda RollsWilton Aerik Polan, MD jws:rbg D: 02/07/2012 19:30:36 ET T: 02/08/2012 10:24:02 ET JOB#: [id number]  cc: Adella HareJ. Wilton Zyere Jiminez, MD, <Dictator> Adella HareWILTON J Jamiah Homeyer MD ELECTRONICALLY SIGNED 02/09/2012 13:59

## 2014-11-09 NOTE — Consult Note (Signed)
Chief Complaint:   Subjective/Chief Complaint Being evaluated for poss AAA surgery. Still some nausea. Low abd pain today. LFT elevated but slowly dropping. Liver serologies so far are neg. Pt could not tell me how long he has been on amiodarone.   VITAL SIGNS/ANCILLARY NOTES: **Vital Signs.:   22-Jul-13 11:19   Vital Signs Type Routine   Temperature Temperature (F) 97.6   Celsius 36.4   Temperature Source Oral   Pulse Pulse 79   Respirations Respirations 20   Systolic BP Systolic BP 127   Diastolic BP (mmHg) Diastolic BP (mmHg) 69   Mean BP 88   Pulse Ox % Pulse Ox % 90   Pulse Ox Activity Level  At rest   Oxygen Delivery 4L   Brief Assessment:   Cardiac Regular    Respiratory clear BS    Gastrointestinal Normal   Lab Results:  Hepatic:  22-Jul-13 04:44    Bilirubin, Total 1.0   Alkaline Phosphatase  184   SGPT (ALT)  316 (12-78 NOTE: NEW REFERENCE RANGE 06/15/2011)   SGOT (AST)  166   Total Protein, Serum  6.0   Albumin, Serum  2.6  Routine Chem:  22-Jul-13 04:44    Glucose, Serum  32   BUN  41   Creatinine (comp) 1.13   Sodium, Serum 143   Potassium, Serum  3.3   Chloride, Serum  92   CO2, Serum  39   Calcium (Total), Serum 8.6   Osmolality (calc) 291   eGFR (African American) >60   eGFR (Non-African American) >60 (eGFR values <60mL/min/1.73 m2 may be an indication of chronic kidney disease (CKD). Calculated eGFR is useful in patients with stable renal function. The eGFR calculation will not be reliable in acutely ill patients when serum creatinine is changing rapidly. It is not useful in  patients on dialysis. The eGFR calculation may not be applicable to patients at the low and high extremes of body sizes, pregnant women, and vegetarians.)   Result Comment GLUCOSE/CO2 - RESULTS VERIFIED BY REPEAT TESTING.  - NOTIFIED OF CRITICAL VALUE  - CALLED TO [facility] AT 0648  - BY GA ON 02/11/12  - READ-BACK PROCESS PERFORMED.  Result(s) reported on 11 Feb 2012 at 06:50AM.   Anion Gap 12  Routine Hem:  22-Jul-13 04:44    WBC (CBC)  14.1   RBC (CBC) 4.81   Hemoglobin (CBC) 13.6   Hematocrit (CBC) 42.5   Platelet Count (CBC) 199   MCV 88   MCH 28.3   MCHC 32.1   RDW  18.5   Neutrophil % 79.4   Lymphocyte % 7.9   Monocyte % 10.8   Eosinophil % 0.5   Basophil % 1.4   Neutrophil #  11.2   Lymphocyte # 1.1   Monocyte #  1.5   Eosinophil # 0.1   Basophil #  0.2 (Result(s) reported on 11 Feb 2012 at 06:29AM.)   Radiology Results: CT:    22-Jul-13 08:59, CT Lumbar Spine Without Contrast   CT Lumbar Spine Without Contrast    REASON FOR EXAM:    progressive lumbar pain with elevated crp  COMMENTS:       PROCEDURE: CT  - CT LUMBAR SPINE WO  - Feb 11 2012  8:59AM     RESULT: Axial CT scanning was performed through the lumbar spine and   adjacent soft tissues. A bone algorithm was employed. The patient did not   receive IV contrast. Images were reconstructed in   the axial plane at 3 mm   intervals and slice thicknesses. Coronal and sagittal reconstructions   were obtained as well.    The psoas musculature is normal in appearance. There is a prominent   infrarenal abdominal aortic aneurysm. This measures 4.8 cm AP by 4.8 cm   transversely x 5.5 cm in length. There is no periaortic leakage of blood   demonstrated on today's images.  There is loss of height of the body of L1 and L2. The L1 loss is   approximately 10% anteriorly. No retropulsed bony fragment is seen. There   is gas within the anterior/inferior aspect of the L1 vertebral body. L2   exhibits 5-10% height loss anteriorly. There is increased density within   the marrow of the body of L2 which may reflect attempts at repair. The   bodies of L3-L5 exhibit mild diffuse osteopenia. There is no evidence of   the facet joint disruption and no pedicle or facet or spinous process   fracture is demonstrated.     The axial images through the spine extend from the inferior aspect  of T12   to the upper aspect of S1.     At T12-L1 there is minimal endplate bar formation but prominent   right-sided facet joint hypertrophy. The AP dimension of the thecal sac   measures at least 12 mm. The left neural foramen is patent. There is     moderate narrowing of the right neural foramen.     At L1-L2 the AP dimension of the thecal sac is 12 mm and the neural   foramina are patent.     At L2-L3 the AP dimension of thethecal sac is approximately 11 mm. Very   mild annular disc bulging is seen. The neural foramina are patent.    At L3-L4 there is mild annular disc bulging. In the midline the AP   dimension of the thecal sac is approximately 12 mm. There is mild   encroachment upon the neural foramina due to the annular bulge.    At L4-L5 annular bulging is also seen. There is also facet joint   hypertrophy. In the midline the AP dimension of the thecal sac is   approximately 11 mm. The neural foramina are patent.  At L5-S1 mild annular disc bulging is present. The neural foramina are   patent. The AP dimension of the thecal sac is approximately 12 mm.    IMPRESSION:   1. There is a large infrarenal abdominal aortic aneurysm measuring at   least 4.8 cm in greatest AP and transverse dimension by approximately 5.5   cm in length. While there is no evidence of perianeurysmal leakage of   blood, this may be symptomatic.  2. There is mild loss of height of the bodies of L1 and L2 that is of   uncertain age. No retropulsed bony fragments are seen and no high-grade   spinal canal stenosis is seen.   3. Facet joint hypertrophy on the right at T12-L1 produces moderate   narrowing of the neural foramen. Elsewhere the neural foramina do not   exhibit significant narrowing due to the annular disc bulging mild facet   joint hypertrophy demonstrated in other levels.  4. There is no evidence of spondylolisthesis.  5. No abnormality within the psoas muscles is demonstrated.    If the  musculoskeletal source of the patient's discomfort remains a   strong clinical concern, a 3 phase nuclear bone scan with SPECT imaging     may be useful given that the patient cannot undergo MRI.          Verified By: DAVID A. JORDAN, M.D., MD   Assessment/Plan:  Assessment/Plan:   Assessment LFT elevation prob related to cardiomyopathy or possibly to amiodarone if he has been on it long term. LFT not getting worse.    Plan Preop evaluations being done. Moniter LFT every few days. No GI interventions planned now. Continue PPI daily. Nausea meds. Thanks   Electronic Signatures: Oh, Paul (MD)  (Signed 22-Jul-13 12:36)  Authored: Chief Complaint, VITAL SIGNS/ANCILLARY NOTES, Brief Assessment, Lab Results, Radiology Results, Assessment/Plan   Last Updated: 22-Jul-13 12:36 by Oh, Paul (MD) 

## 2014-11-09 NOTE — Consult Note (Signed)
Chief Complaint:   Subjective/Chief Complaint Pt remains intubated on vent. On dopamine drip. On versed/fentanyl drips as well. Mild rise in LFT.   VITAL SIGNS/ANCILLARY NOTES: **Vital Signs.:   29-Jul-13 13:00   Vital Signs Type Routine   Brief Assessment:   Cardiac Regular    Respiratory clear BS    Gastrointestinal Normal   Lab Results: Hepatic:  29-Jul-13 03:30    Bilirubin, Total  2.6   Bilirubin, Direct  1.60 (Result(s) reported on 18 Feb 2012 at 03:49AM.)   Alkaline Phosphatase  167   SGPT (ALT) 69 (12-78 NOTE: NEW REFERENCE RANGE 06/15/2011)   SGOT (AST)  98   Total Protein, Serum  5.2   Albumin, Serum  1.6  Lab:  29-Jul-13 09:00    pH (ABG)  7.46   PCO2  69   PO2  57   FiO2 100   Base Excess  21.0   HCO3  49.1   O2 Saturation 90.8   Specimen Site (ABG) A-LINE   Patient Temp (ABG) 37.0   Vt 500   PSV 15   PEEP 5.0   Mechanical Rate 20 (Result(s) reported on 18 Feb 2012 at 09:18AM.)  Routine Chem:  29-Jul-13 03:30    Glucose, Serum  202   BUN  42   Creatinine (comp)  1.33   Sodium, Serum 145   Potassium, Serum 4.0   Chloride, Serum  97   CO2, Serum  44   Calcium (Total), Serum  8.1   Anion Gap  4   Osmolality (calc) 305   eGFR (African American) >60   eGFR (Non-African American)  53 (eGFR values <47m/min/1.73 m2 may be an indication of chronic kidney disease (CKD). Calculated eGFR is useful in patients with stable renal function. The eGFR calculation will not be reliable in acutely ill patients when serum creatinine is changing rapidly. It is not useful in  patients on dialysis. The eGFR calculation may not be applicable to patients at the low and high extremes of body sizes, pregnant women, and vegetarians.)   Result Comment LABS - This specimen was collected through an   - indwelling catheter or arterial line.  - A minimum of 56m of blood was wasted prior    - to collecting the sample.  Interpret  - results with caution. CO2 - RESULTS  VERIFIED BY REPEAT TESTING. - CRITICAL VALUE PREVIOUSLY NOTIFIED.  - TPL  Result(s) reported on 18 Feb 2012 at 06:05AM.  Routine Hem:  29-Jul-13 03:30    WBC (CBC)  14.2   RBC (CBC) 4.69   Hemoglobin (CBC) 13.1   Hematocrit (CBC) 40.6   Platelet Count (CBC) 153   MCV 87   MCH 27.9   MCHC 32.2   RDW  18.1   Neutrophil % 82.7   Lymphocyte % 9.2   Monocyte % 6.7   Eosinophil % 1.2   Basophil % 0.2   Neutrophil #  11.8   Lymphocyte # 1.3   Monocyte # 1.0   Eosinophil # 0.2   Basophil # 0.0 (Result(s) reported on 18 Feb 2012 at 03:49AM.)   Assessment/Plan:  Assessment/Plan:   Assessment LFT elevation, prob related to either meds or CHF.. Marland Kitchenelative hypotension ran cause of LFT to rise over last few days.    Plan Consider repeating liver U/S if LFT continues to rise. Normal in 7/17. No other recommedation from usKoreanless LFT jumps out dramatically. Will sign off. Call back if conditions changes. Thanks.   Electronic Signatures:  Verdie Shire (MD)  (Signed 29-Jul-13 13:47)  Authored: Chief Complaint, VITAL SIGNS/ANCILLARY NOTES, Brief Assessment, Lab Results, Assessment/Plan   Last Updated: 29-Jul-13 13:47 by Verdie Shire (MD)
